# Patient Record
Sex: Male | Born: 1980 | Race: Black or African American | Hispanic: No | Marital: Married | Smoking: Never smoker
Health system: Southern US, Community
[De-identification: ages and names within clinical notes are randomized; demographics above are authoritative.]

## PROBLEM LIST (undated history)

## (undated) DIAGNOSIS — E669 Obesity, unspecified: Secondary | ICD-10-CM

## (undated) DIAGNOSIS — R079 Chest pain, unspecified: Secondary | ICD-10-CM

## (undated) DIAGNOSIS — I10 Essential (primary) hypertension: Secondary | ICD-10-CM

## (undated) DIAGNOSIS — G4730 Sleep apnea, unspecified: Secondary | ICD-10-CM

---
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
## 2001-07-02 ENCOUNTER — Emergency Department (HOSPITAL_COMMUNITY): Admission: EM | Admit: 2001-07-02 | Discharge: 2001-07-02 | Payer: Self-pay | Admitting: Emergency Medicine

## 2001-07-02 ENCOUNTER — Encounter: Payer: Self-pay | Admitting: Emergency Medicine

## 2002-09-16 ENCOUNTER — Encounter: Admission: RE | Admit: 2002-09-16 | Discharge: 2002-09-16 | Payer: Self-pay | Admitting: Family Medicine

## 2003-10-16 ENCOUNTER — Emergency Department (HOSPITAL_COMMUNITY): Admission: EM | Admit: 2003-10-16 | Discharge: 2003-10-16 | Payer: Self-pay | Admitting: Emergency Medicine

## 2004-01-02 ENCOUNTER — Emergency Department (HOSPITAL_COMMUNITY): Admission: EM | Admit: 2004-01-02 | Discharge: 2004-01-02 | Payer: Self-pay | Admitting: Emergency Medicine

## 2004-02-14 ENCOUNTER — Emergency Department (HOSPITAL_COMMUNITY): Admission: EM | Admit: 2004-02-14 | Discharge: 2004-02-14 | Payer: Self-pay | Admitting: Emergency Medicine

## 2004-10-22 ENCOUNTER — Ambulatory Visit: Payer: Self-pay | Admitting: Family Medicine

## 2005-08-20 ENCOUNTER — Emergency Department (HOSPITAL_COMMUNITY): Admission: EM | Admit: 2005-08-20 | Discharge: 2005-08-20 | Payer: Self-pay | Admitting: Emergency Medicine

## 2005-12-07 ENCOUNTER — Emergency Department (HOSPITAL_COMMUNITY): Admission: AD | Admit: 2005-12-07 | Discharge: 2005-12-07 | Payer: Self-pay | Admitting: Family Medicine

## 2008-10-14 ENCOUNTER — Emergency Department (HOSPITAL_COMMUNITY): Admission: EM | Admit: 2008-10-14 | Discharge: 2008-10-14 | Payer: Self-pay | Admitting: Emergency Medicine

## 2008-10-18 ENCOUNTER — Emergency Department (HOSPITAL_COMMUNITY): Admission: EM | Admit: 2008-10-18 | Discharge: 2008-10-18 | Payer: Self-pay | Admitting: Emergency Medicine

## 2009-09-10 ENCOUNTER — Emergency Department (HOSPITAL_COMMUNITY): Admission: EM | Admit: 2009-09-10 | Discharge: 2009-09-10 | Payer: Self-pay | Admitting: Family Medicine

## 2015-02-10 ENCOUNTER — Ambulatory Visit (INDEPENDENT_AMBULATORY_CARE_PROVIDER_SITE_OTHER): Payer: BC Managed Care – PPO | Admitting: Endocrinology

## 2015-02-10 ENCOUNTER — Encounter: Payer: Self-pay | Admitting: Endocrinology

## 2015-02-10 VITALS — BP 140/86 | HR 97 | Temp 98.3°F | Resp 16 | Ht 73.0 in | Wt 373.4 lb

## 2015-02-10 DIAGNOSIS — E291 Testicular hypofunction: Secondary | ICD-10-CM

## 2015-02-10 DIAGNOSIS — G4710 Hypersomnia, unspecified: Secondary | ICD-10-CM

## 2015-02-10 DIAGNOSIS — R400 Somnolence: Secondary | ICD-10-CM

## 2015-02-10 NOTE — Progress Notes (Signed)
Patient ID: Timothy [name], male   DOB: 05-Jun-1981, 34 y.o.   MRN: [id number]          Chief complaint: Low energy level  History of Present Illness  HypogonadismWas diagnosed in 12/2014  He has had complaints ofsignificant fatigue, decreased motivation, significantly decreased libido, lack of energy  The symptoms started in 2015 at the end of summer and have been persistent He also has had a tendency to breast enlargement since his late teens but it has been more prominent in the last year and a half.    There is no history of the following: Hot flushes, sweats long term steroid use, history of testicular injury mumps in childhood. He had an episode of epididymitis a few years ago treated with antibiotics Patient does desire to have children in about a year or so.  He does have a 7649-month-old child currently  Prior lab results show baselinetestosterone level of 229 drawn at 10 AM in 12/2014 Has not had any other labs done Is now referred here for further evaluation       Medication List       This list is accurate as of: 02/10/15  2:09 PM.  Always use your most recent med list.               valACYclovir 500 MG tablet  Commonly known as:  VALTREX        Allergies:  Allergies  Allergen Reactions  . Amoxicillin Rash    No past medical history on file.  No past surgical history on file.  No family history on file.  Social History:  reports that he has never smoked. He has never used smokeless tobacco. His alcohol and drug histories are not on file.  ROS   WEIGHT gain: He has had difficulty losing weight.  Currently at his maximum weight ever. He says that he has gained about 15 pounds in the last few months His best weight has been about 265 but was never able to maintain this weight loss  No history of unusual headaches  No history of blurred vision including peripheral vision Sometimes will have floaters in his eyes  History of hypertension:  None.  Blood pressure is relatively high today.  No records are available from previous physician  Has history of some palpitations and was reportedly having some PVCs on Holter monitor  No history of abnormal fasting glucose or diabetes but is nonfasting glucose was 127 in 3/16  He complains of early insomnia.  Also complains of daytime somnolence He does have significant snoring and his wife thinks that he has some sleep apnea episodes during the night at times  No swelling of the feet  No change in bowel habits  No numbness or tingling in his hands or feet except some numbness and he is sitting for long  No shortness of breath on exertion  General Examination:   BP 140/86 mmHg  Pulse 97  Temp(Src) 98.3 F (36.8 C)  Resp 16  Ht 6\' 1"  (1.854 m)  Wt 373 lb 6.4 oz (169.373 kg)  BMI 49.27 kg/m2  SpO2 97%  GENERAL APPEARANCE: Marked generalized obesity present.  No cushingoid features  SKIN:normal, no rash or pigmentation.  HEENT:Oral mucosa normal. Normal oropharyngeal opening.  EYES:normal external appearance of eyes, Fundi benign.   NECK:Skin shows no significant acanthosis.  no lymphadenopathy, no thyromegaly.  CHEST: Gynecomastia present bilaterally, with breast tissue measuring about 3-4 cm.  Also has a lot of  fatty tissue in the breast area LUNGS:clear to auscultation bilaterally, no wheezes, rhonchi, rales.   HEART:normal S1 And S2, no S3, S4, murmur or click.  ABDOMEN:no hepatosplenomegaly, no masses palpated, soft and not tender.   MALE GENITOURINARY:3.5 left testicle cm and right testicle 3 cm and firm.   MUSCULOSKELETALNo enlargement or deformity of joints.  EXTREMITIES:no clubbing, no edema.  NEUROLOGIC EXAM: Biceps reflexes normal (2+) bilaterally.   Assessment/ Plan: 1. 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 On exam he has mild decrease in testicular size especially on the right and significant gynecomastia He has not had any evaluation for causes of his hypogonadism and does also need to have early morning free testosterone level done to confirm low levels Will have LH and prolactin levels done also  Discussed treatment options and since he and his wife may desire to have a child next year may not be optimal to use testosterone supplements in order to preserve fertility.  Discussed possible treatment with clomiphene. If this is started he will need to be followed up about 2 months later with repeat testosterone level Also discussed the importance of weight loss to improve insulin resistance which he likely has  2.  Morbid obesity: He will need to be starting a regular exercise program although currently not doing much because of lack of energy.  Also would benefit from nutritional counseling especially if he has any degree of impaired fasting glucose  3.  Family history of diabetes: He will have fasting glucose checked, may need A1c also if this is high  4.  Probable sleep apnea.  He will contact his PCP to consider a sleep study   Timothy [name] 02/10/2015, 2:09 PM

## 2015-02-15 ENCOUNTER — Other Ambulatory Visit (INDEPENDENT_AMBULATORY_CARE_PROVIDER_SITE_OTHER): Payer: BC Managed Care – PPO

## 2015-02-15 DIAGNOSIS — E291 Testicular hypofunction: Secondary | ICD-10-CM

## 2015-02-15 LAB — LUTEINIZING HORMONE: LH: 3.19 m[IU]/mL (ref 1.50–9.30)

## 2015-02-15 LAB — GLUCOSE, RANDOM: Glucose, Bld: 105 mg/dL — ABNORMAL HIGH (ref 70–99)

## 2015-02-16 ENCOUNTER — Other Ambulatory Visit: Payer: Self-pay | Admitting: *Deleted

## 2015-02-16 ENCOUNTER — Telehealth: Payer: Self-pay | Admitting: Endocrinology

## 2015-02-16 ENCOUNTER — Other Ambulatory Visit: Payer: Self-pay | Admitting: Endocrinology

## 2015-02-16 DIAGNOSIS — R7301 Impaired fasting glucose: Secondary | ICD-10-CM

## 2015-02-16 MED ORDER — CLOMIPHENE CITRATE 50 MG PO TABS: ORAL_TABLET | ORAL | Status: DC

## 2015-02-16 NOTE — Telephone Encounter (Signed)
Results were given to patient today, rx was sent for Clomid.

## 2015-02-16 NOTE — Telephone Encounter (Signed)
Patient is calling for his lab results. °

## 2015-02-18 LAB — TESTOSTERONE, FREE, TOTAL, SHBG
Sex Hormone Binding: 23.1 nmol/L (ref 16.5–55.9)
Testosterone, Free: 6.5 pg/mL — ABNORMAL LOW (ref 8.7–25.1)
Testosterone: 290 ng/dL — ABNORMAL LOW (ref 348–1197)

## 2015-02-18 LAB — PROLACTIN: PROLACTIN: 9.6 ng/mL (ref 4.0–15.2)

## 2015-04-17 ENCOUNTER — Ambulatory Visit (INDEPENDENT_AMBULATORY_CARE_PROVIDER_SITE_OTHER): Payer: BC Managed Care – PPO | Admitting: Endocrinology

## 2015-04-17 ENCOUNTER — Encounter: Payer: Self-pay | Admitting: Endocrinology

## 2015-04-17 VITALS — BP 126/90 | HR 83 | Temp 98.1°F | Resp 16 | Ht 72.0 in | Wt 388.2 lb

## 2015-04-17 DIAGNOSIS — E291 Testicular hypofunction: Secondary | ICD-10-CM | POA: Diagnosis not present

## 2015-04-17 DIAGNOSIS — R7301 Impaired fasting glucose: Secondary | ICD-10-CM

## 2015-04-17 DIAGNOSIS — G4710 Hypersomnia, unspecified: Secondary | ICD-10-CM

## 2015-04-17 DIAGNOSIS — R400 Somnolence: Secondary | ICD-10-CM

## 2015-04-17 LAB — TESTOSTERONE: Testosterone: 414.23 ng/dL (ref 300.00–890.00)

## 2015-04-17 LAB — HEMOGLOBIN A1C: Hgb A1c MFr Bld: 5.7 % (ref 4.6–6.5)

## 2015-04-17 LAB — T4, FREE: Free T4: 0.71 ng/dL (ref 0.60–1.60)

## 2015-04-17 NOTE — Progress Notes (Signed)
Quick Note:  Please let patient know that the testosterone result is normal and change in dosage needed, diabetes test okay. Can follow-up in 4 months instead of 2 with labs  ______

## 2015-04-17 NOTE — Progress Notes (Signed)
Patient ID: LARWENCE TU, male   DOB: March 15, 1981, 34 y.o.   MRN: [id number]          Chief complaint: Low testosterone follow-up  History of Present Illness  HypogonadismWas diagnosed in 12/2014  He had complaints ofsignificant fatigue, decreased motivation, significantly decreased libido and breast enlargement prior to his evaluation The symptoms started in 2015 at the end of summer   Prior lab results show baselinetestosterone level of 229 drawn at 10 AM in 12/2014 His evaluation initially showed the following:  No visits with results within 1 Month(s) from this visit. Latest known visit with results is:  Lab on 02/15/2015  Component Date Value Ref Range Status  . Prolactin 02/15/2015 9.6  4.0 - 15.2 ng/mL Final  . LH 02/15/2015 3.19  1.50 - 9.30 mIU/mL Final   Comment: Male Reference Range:20-70 yrs     1.5-9.3 mIU/mL>70 yrs       3.1-35.6 mIU/mLFemale Reference Range:Follicular Phase     1.9-12.5 mIU/mLMidcycle             8.7-76.3 mIU/mLLuteal Phase         0.5-16.9 mIU/mL  Post Menopausal      15.9-54.0  mIU/mLPregnant             <1.5 mIU/mLContraceptives       0.7-5.6 mIU/mL   . Testosterone 02/15/2015 290* 348 - 1197 ng/dL Final  . Comment, Testosterone 02/15/2015 Comment   Final   Comment: Adult male reference interval is based on a population of lean males up to 33 years old.   . Testosterone, Free 02/15/2015 6.5* 8.7 - 25.1 pg/mL Final  . Sex Hormone Binding 02/15/2015 23.1  16.5 - 55.9 nmol/L Final  . Glucose, Bld 02/15/2015 105* 70 - 99 mg/dL Final    Because of his desire to maintain his fertility he has been started on clomiphene half tablet 3 times a week. He says within a week of starting this he has had more energy and less somnolence.  Also libido is improved  OBESITY: He says he has started doing exercise with a trainer 3 days a week but with a two-week vacation in [place] he did not exercise or watch his diet and has actually gained weight. He  has not been able to set up is appointment with his nutritionist for initial consultation  FASTING glucose was 105, not previously diagnosed as prediabetes   Wt Readings from Last 3 Encounters:  04/17/15 388 lb 3.2 oz (176.086 kg)  02/10/15 373 lb 6.4 oz (169.373 kg)         Medication List       This list is accurate as of: 04/17/15  9:13 AM.  Always use your most recent med list.               clomiPHENE 50 MG tablet  Commonly known as:  CLOMID  Take half a tablet 3 times per week     valACYclovir 500 MG tablet  Commonly known as:  VALTREX        Allergies:  Allergies  Allergen Reactions  . Amoxicillin Rash    No past medical history on file.  No past surgical history on file.  Family History  Problem Relation Age of Onset  . Diabetes Mother   . Hypertension Mother   . Diabetes Sister     Social History:  reports that he has never smoked. He has never used smokeless tobacco. His alcohol and drug histories are not on  file.  ROS    He has had complaints of early insomnia.  Also some daytime somnolence He does have significant snoring and his wife thinks that he has some sleep apnea episodes during the night at times   General Examination:   BP 126/90 mmHg  Pulse 83  Temp(Src) 98.1 F (36.7 C)  Resp 16  Ht 6' (1.829 m)  Wt 388 lb 3.2 oz (176.086 kg)  BMI 52.64 kg/m2  SpO2 96%    Assessment/ Plan: 1. Hypogonadotropic hypogonadism associated with obesity He has had subjective improvement when starting clomiphene Needs follow-up testosterone level to adjust his medication Also discussed need for continued efforts to lose weight as he is likely to be significantly insulin resistant causing the above syndrome Will adjust his clomiphene as needed based on his labs today  2.  Morbid obesity: He will be starting a regular exercise program and schedule nutritional counseling  3.  Family history of diabetes: He has impaired fasting glucose and  will check A1c   4.  Probable sleep apnea.  To be evaluated by PCP.  Also needs to be followed for his hypertension by PCP Will check his free T4 level to rule out secondary hypothyroidism also   Bobetta Korf 04/17/2015, 9:13 AM

## 2015-04-17 NOTE — Addendum Note (Signed)
Addended by: Gayla MedicusSTEWART, Dvid Pendry L on: 04/17/2015 09:32 AM   Modules accepted: Orders

## 2015-06-14 ENCOUNTER — Other Ambulatory Visit: Payer: BC Managed Care – PPO

## 2015-06-19 ENCOUNTER — Ambulatory Visit: Payer: BC Managed Care – PPO | Admitting: Endocrinology

## 2015-07-10 ENCOUNTER — Telehealth: Payer: Self-pay | Admitting: Endocrinology

## 2015-07-10 NOTE — Telephone Encounter (Signed)
Needs [place] to [facility] in [facility]

## 2015-07-21 ENCOUNTER — Other Ambulatory Visit (INDEPENDENT_AMBULATORY_CARE_PROVIDER_SITE_OTHER): Payer: BC Managed Care – PPO

## 2015-07-21 ENCOUNTER — Other Ambulatory Visit: Payer: Self-pay | Admitting: *Deleted

## 2015-07-21 DIAGNOSIS — E291 Testicular hypofunction: Secondary | ICD-10-CM | POA: Diagnosis not present

## 2015-07-21 LAB — TESTOSTERONE: Testosterone: 282.94 ng/dL — ABNORMAL LOW (ref 300.00–890.00)

## 2015-07-21 MED ORDER — CLOMIPHENE CITRATE 50 MG PO TABS: ORAL_TABLET | ORAL | Status: DC

## 2015-07-28 ENCOUNTER — Encounter: Payer: Self-pay | Admitting: Endocrinology

## 2015-07-28 ENCOUNTER — Ambulatory Visit (INDEPENDENT_AMBULATORY_CARE_PROVIDER_SITE_OTHER): Payer: BC Managed Care – PPO | Admitting: Endocrinology

## 2015-07-28 VITALS — BP 120/78 | HR 77 | Temp 98.3°F | Resp 14 | Ht 72.0 in | Wt 388.6 lb

## 2015-07-28 DIAGNOSIS — E291 Testicular hypofunction: Secondary | ICD-10-CM | POA: Diagnosis not present

## 2015-07-28 NOTE — Progress Notes (Signed)
Patient ID: Timothy [name], male   DOB: 1980/12/22, 34 y.o.   MRN: [id number]          Chief complaint: Low testosterone follow-up  History of Present Illness  HypogonadismWas diagnosed in 12/2014  He had complaints ofsignificant fatigue, decreased motivation, significantly decreased libido and breast enlargement prior to his evaluation The symptoms started in 2015 at the end of summer   Prior lab results show baselinetestosterone level of 229 drawn at 10 AM in 12/2014 His evaluation initially showed the following labs Free testosterone level was 6.5, prolactin 9.6, LH 3.2  Because of his desire to maintain his fertility he has been on clomiphene half tablet 3 times a week. With this treatment he has had progressive improvement in his energy level as well as libido and has been feeling good overall Although his testosterone level and improve back to normal range in July he has a relatively low level now He now says that he has been missing a few doses of clomiphene both in late September and before his lab was done  Lab Results  Component Value Date   TESTOSTERONE 282.94* 07/21/2015     OBESITY: He says he has started doing exercise with a trainer 3 days a week for the last 2-3 months  However with going on vacation twice he has not been able to lose weight  FASTING glucose was 105 previously but A1c is normal    Wt Readings from Last 3 Encounters:  07/28/15 388 lb 9.6 oz (176.268 kg)  04/17/15 388 lb 3.2 oz (176.086 kg)  02/10/15 373 lb 6.4 oz (169.373 kg)         Medication List       This list is accurate as of: 07/28/15  9:00 AM.  Always use your most recent med list.               clomiPHENE 50 MG tablet  Commonly known as:  CLOMID  Take half a tablet 3 times per week     valACYclovir 500 MG tablet  Commonly known as:  VALTREX        Allergies:  Allergies  Allergen Reactions  . Amoxicillin Rash    No past medical history on  file.  No past surgical history on file.  Family History  Problem Relation Age of Onset  . Diabetes Mother   . Hypertension Mother   . Diabetes Sister     Social History:  reports that he has never smoked. He has never used smokeless tobacco. His alcohol and drug histories are not on file.  ROS    History of significant snoring and has been recommended sleep study   General Examination:   BP 120/78 mmHg  Pulse 77  Temp(Src) 98.3 F (36.8 C)  Resp 14  Ht 6' (1.829 m)  Wt 388 lb 9.6 oz (176.268 kg)  BMI 52.69 kg/m2  SpO2 96%  He still has significant bilateral gynecomastia  Assessment/ Plan: 1. Hypogonadotropic hypogonadism associated with obesity He has had subjective improvement when starting clomiphene Although his testosterone level had come back to the normal range with the half tablet 3 times a day the level is again relatively low now because of noncompliance with the medication within the month prior to his test  He will start improving his compliance from now on Needs follow-up testosterone level in about 6 weeks again  2.  Morbid obesity: He has been starting a regular exercise program but has not been consistent  with diet especially with vacationing   Lamona Eimer 07/28/2015, 9:00 AM

## 2015-09-22 ENCOUNTER — Other Ambulatory Visit: Payer: BC Managed Care – PPO

## 2015-11-23 ENCOUNTER — Other Ambulatory Visit: Payer: BC Managed Care – PPO

## 2016-01-26 ENCOUNTER — Ambulatory Visit: Payer: BC Managed Care – PPO | Admitting: Endocrinology

## 2016-01-26 DIAGNOSIS — Z0289 Encounter for other administrative examinations: Secondary | ICD-10-CM

## 2016-03-30 ENCOUNTER — Emergency Department (HOSPITAL_COMMUNITY): Payer: BC Managed Care – PPO

## 2016-03-30 ENCOUNTER — Observation Stay (HOSPITAL_COMMUNITY)
Admission: EM | Admit: 2016-03-30 | Discharge: 2016-03-31 | Disposition: A | Discharge status: Home / Self Care | Payer: BC Managed Care – PPO | Attending: Family Medicine | Admitting: Family Medicine

## 2016-03-30 ENCOUNTER — Other Ambulatory Visit: Payer: Self-pay

## 2016-03-30 ENCOUNTER — Encounter (HOSPITAL_COMMUNITY): Payer: Self-pay | Admitting: Emergency Medicine

## 2016-03-30 DIAGNOSIS — R0789 Other chest pain: Secondary | ICD-10-CM

## 2016-03-30 DIAGNOSIS — E669 Obesity, unspecified: Secondary | ICD-10-CM | POA: Diagnosis present

## 2016-03-30 DIAGNOSIS — R079 Chest pain, unspecified: Principal | ICD-10-CM

## 2016-03-30 DIAGNOSIS — R9431 Abnormal electrocardiogram [ECG] [EKG]: Secondary | ICD-10-CM | POA: Diagnosis present

## 2016-03-30 HISTORY — DX: Obesity, unspecified: E66.9

## 2016-03-30 HISTORY — DX: Chest pain, unspecified: R07.9

## 2016-03-30 LAB — CBC WITH DIFFERENTIAL/PLATELET
BASOS ABS: 0 10*3/uL (ref 0.0–0.1)
BASOS PCT: 0 %
Eosinophils Absolute: 0.4 10*3/uL (ref 0.0–0.7)
Eosinophils Relative: 6 %
HEMATOCRIT: 39.5 % (ref 39.0–52.0)
HEMOGLOBIN: 13.6 g/dL (ref 13.0–17.0)
Lymphocytes Relative: 35 %
Lymphs Abs: 2.1 10*3/uL (ref 0.7–4.0)
MCH: 29.7 pg (ref 26.0–34.0)
MCHC: 34.4 g/dL (ref 30.0–36.0)
MCV: 86.2 fL (ref 78.0–100.0)
Monocytes Absolute: 0.3 10*3/uL (ref 0.1–1.0)
Monocytes Relative: 4 %
NEUTROS ABS: 3.3 10*3/uL (ref 1.7–7.7)
NEUTROS PCT: 55 %
Platelets: 218 10*3/uL (ref 150–400)
RBC: 4.58 MIL/uL (ref 4.22–5.81)
RDW: 12.8 % (ref 11.5–15.5)
WBC: 6 10*3/uL (ref 4.0–10.5)

## 2016-03-30 LAB — LIPID PANEL
Cholesterol: 115 mg/dL (ref 0–200)
HDL: 35 mg/dL — AB (ref >40)
LDL CALC: 22 mg/dL (ref 0–99)
Total CHOL/HDL Ratio: 3.3 RATIO
Triglycerides: 288 mg/dL — ABNORMAL HIGH (ref <150)
VLDL: 58 mg/dL — ABNORMAL HIGH (ref 0–40)

## 2016-03-30 LAB — COMPREHENSIVE METABOLIC PANEL
ALK PHOS: 48 U/L (ref 38–126)
ALT: 24 U/L (ref 17–63)
ANION GAP: 6 (ref 5–15)
AST: 26 U/L (ref 15–41)
Albumin: 3.6 g/dL (ref 3.5–5.0)
BILIRUBIN TOTAL: 0.2 mg/dL — AB (ref 0.3–1.2)
BUN: 8 mg/dL (ref 6–20)
CALCIUM: 9.2 mg/dL (ref 8.9–10.3)
CO2: 26 mmol/L (ref 22–32)
Chloride: 109 mmol/L (ref 101–111)
Creatinine, Ser: 0.98 mg/dL (ref 0.61–1.24)
Glucose, Bld: 117 mg/dL — ABNORMAL HIGH (ref 65–99)
Potassium: 3.8 mmol/L (ref 3.5–5.1)
SODIUM: 141 mmol/L (ref 135–145)
TOTAL PROTEIN: 6.5 g/dL (ref 6.5–8.1)

## 2016-03-30 LAB — I-STAT TROPONIN, ED: Troponin i, poc: 0 ng/mL (ref 0.00–0.08)

## 2016-03-30 LAB — TROPONIN I

## 2016-03-30 MED ORDER — SODIUM CHLORIDE 0.9 % IV SOLN: INTRAVENOUS | Status: AC

## 2016-03-30 MED ORDER — ASPIRIN 325 MG PO TABS
325.0000 mg | ORAL_TABLET | Freq: Every day | ORAL | Status: DC
  Administered 2016-03-31: 325 mg via ORAL
  Filled 2016-03-30: qty 1

## 2016-03-30 MED ORDER — NITROGLYCERIN 0.4 MG SL SUBL: 0.4000 mg | SUBLINGUAL_TABLET | SUBLINGUAL | Status: DC | PRN

## 2016-03-30 MED ORDER — MORPHINE SULFATE (PF) 2 MG/ML IV SOLN: 2.0000 mg | INTRAVENOUS | Status: DC | PRN

## 2016-03-30 MED ORDER — ONDANSETRON HCL 4 MG/2ML IJ SOLN: 4.0000 mg | Freq: Four times a day (QID) | INTRAMUSCULAR | Status: DC | PRN

## 2016-03-30 MED ORDER — ACETAMINOPHEN 325 MG PO TABS: 650.0000 mg | ORAL_TABLET | ORAL | Status: DC | PRN

## 2016-03-30 MED ORDER — ENOXAPARIN SODIUM 40 MG/0.4ML ~~LOC~~ SOLN: 40.0000 mg | SUBCUTANEOUS | Status: DC

## 2016-03-30 MED ORDER — GI COCKTAIL ~~LOC~~: 30.0000 mL | Freq: Four times a day (QID) | ORAL | Status: DC | PRN

## 2016-03-30 NOTE — ED Notes (Signed)
Patient transported to X-ray 

## 2016-03-30 NOTE — ED Notes (Addendum)
324 aspirin and 1 nitro given by EMS; pain remained unchanged. BP dropped. Patient states he has this pain twice a week with activity and the only reason this is different is it radiated down arm which prompted him to call EMS.

## 2016-03-30 NOTE — H&P (Signed)
History and Physical    Timothy [name] ZOX:[id number] DOB: October 31, 1980 DOA: 03/30/2016  PCP: Boneta LucksJennifer Brown, NP Patient coming from: home  Chief Complaint: chest pain with exertion  HPI: Timothy PaganiniJonathan A Grafton is a 35 y.o. male with no significant past medical history resents to the emergency department with the chief complaint of chest pain. Initial evaluation does reveal an abnormal EKG and patient is evaluated by cardiology who recommend admitting for rule out  Information is obtained from the patient he reports episode of chest pain while moving 3 lens this morning. He describes the pain as a "tightness" located left chest radiating out into his right arm. He denies any headache dizziness diaphoresis nausea or vomiting. He denies any palpitations. He denies a numbness or tingling of his upper extremities. He reports the episode lasted 4-5 minutes. He denies any abdominal pain nausea vomiting diarrhea constipation. He denies any dysuria hematuria frequency or urgency. He denies headache visual disturbances syncope or near-syncope. He denies any lower extremity edema orthopnea.    ED Course:   Review of Systems: As per HPI otherwise 10 point review of systems negative.   Ambulatory Status: Ambulates independently with steady gait  Past Medical History  Diagnosis Date  . Exertional chest pain   . Obesity     History reviewed. No pertinent past surgical history.  Social History   Social History  . Marital Status: Married    Spouse Name: N/A  . Number of Children: N/A  . Years of Education: N/A   Occupational History  . Not on file.   Social History Main Topics  . Smoking status: Never Smoker   . Smokeless tobacco: Never Used  . Alcohol Use: Not on file  . Drug Use: Not on file  . Sexual Activity: Not on file   Other Topics Concern  . Not on file   Social History Narrative    Allergies  Allergen Reactions  . Amoxicillin Rash    Family History  Problem Relation  Age of Onset  . Diabetes Mother   . Hypertension Mother   . Diabetes Sister     Prior to Admission medications   Medication Sig Start Date End Date Taking? Authorizing Provider  Multiple Vitamin (MULTIVITAMIN) tablet Take 1 tablet by mouth daily.   Yes Historical Provider, MD  valACYclovir (VALTREX) 500 MG tablet Take 500 mg by mouth daily as needed (for outbreaks).  01/01/15  Yes Historical Provider, MD    Physical Exam: Filed Vitals:   03/30/16 1354 03/30/16 1400 03/30/16 1415  BP: 133/65 127/68 131/74  Pulse: 102 103 96  Temp: 98.5 F (36.9 C)    TempSrc: Oral    Resp: 22 12 16   Weight: 167.377 kg (369 lb)    SpO2: 96% 96% 97%     General:  Appears calm and comfortable,  Eyes:  PERRL, EOMI, normal lids, iris ENT:  grossly normal hearing, lips & tongue, mmm Neck:  no LAD, masses or thyromegaly Cardiovascular:  RRR, no m/r/g. No LE edema.  Respiratory:  CTA bilaterally, no w/r/r. Normal respiratory effort. Abdomen:  soft, ntnd, NABS Skin:  no rash or induration seen on limited exam Musculoskeletal:  grossly normal tone BUE/BLE, good ROM, no bony abnormality Psychiatric:  grossly normal mood and affect, speech fluent and appropriate, AOx3 Neurologic:  CN 2-12 grossly intact, moves all extremities in coordinated fashion, sensation intact  Labs on Admission: I have personally reviewed following labs and imaging studies  CBC:  Recent Labs Lab 03/30/16  1444  WBC 6.0  NEUTROABS 3.3  HGB 13.6  HCT 39.5  MCV 86.2  PLT 218   Basic Metabolic Panel:  Recent Labs Lab 03/30/16 1444  NA 141  K 3.8  CL 109  CO2 26  GLUCOSE 117*  BUN 8  CREATININE 0.98  CALCIUM 9.2   GFR: CrCl cannot be calculated (Unknown ideal weight.). Liver Function Tests:  Recent Labs Lab 03/30/16 1444  AST 26  ALT 24  ALKPHOS 48  BILITOT 0.2*  PROT 6.5  ALBUMIN 3.6   No results for input(s): LIPASE, AMYLASE in the last 168 hours. No results for input(s): AMMONIA in the last 168  hours. Coagulation Profile: No results for input(s): INR, PROTIME in the last 168 hours. Cardiac Enzymes: No results for input(s): CKTOTAL, CKMB, CKMBINDEX, TROPONINI in the last 168 hours. BNP (last 3 results) No results for input(s): PROBNP in the last 8760 hours. HbA1C: No results for input(s): HGBA1C in the last 72 hours. CBG: No results for input(s): GLUCAP in the last 168 hours. Lipid Profile: No results for input(s): CHOL, HDL, LDLCALC, TRIG, CHOLHDL, LDLDIRECT in the last 72 hours. Thyroid Function Tests: No results for input(s): TSH, T4TOTAL, FREET4, T3FREE, THYROIDAB in the last 72 hours. Anemia Panel: No results for input(s): VITAMINB12, FOLATE, FERRITIN, TIBC, IRON, RETICCTPCT in the last 72 hours. Urine analysis: No results found for: COLORURINE, APPEARANCEUR, LABSPEC, PHURINE, GLUCOSEU, HGBUR, BILIRUBINUR, KETONESUR, PROTEINUR, UROBILINOGEN, NITRITE, LEUKOCYTESUR  Creatinine Clearance: CrCl cannot be calculated (Unknown ideal weight.).  Sepsis Labs: @LABRCNTIP (procalcitonin:4,lacticidven:4) )No results found for this or any previous visit (from the past 240 hour(s)).   Radiological Exams on Admission: Dg Chest 2 View  03/30/2016  CLINICAL DATA:  Pt was doing yard work when he began having a strange pain in his arm. 324 aspirin and 1 nitro given by EMS; pain remained unchanged. BP dropped. Patient states he has this pain twice a week with activity. EXAM: CHEST  2 VIEW COMPARISON:  08/20/2005 FINDINGS: Normal mediastinum and cardiac silhouette. Normal pulmonary vasculature. No evidence of effusion, infiltrate, or pneumothorax. No acute bony abnormality. IMPRESSION: No acute cardiopulmonary process. Electronically Signed   By: Genevive BiStewart  Edmunds M.D.   On: 03/30/2016 14:50    EKG: Independently reviewed. Sinus tachycardia Borderline T abnormalities, inferior leads Abnormal ekg Assessment/Plan Principal Problem:   Abnormal EKG Active Problems:   Chest pain   Obesity     #1. Chest pain with exertion/abnormal EKG. Heart score is 1. She'll troponin negative. Chest x-ray without acute cardiopulmonary process. Patient is evaluated by cardiology who recommends overnight admission to rule out. -Admit to telemetry -Cycle troponin -Serial EKG -Lipid panel -Hemoglobin A1c -asa -ntg as needed -gi cocktail as needed  #2. Obesity. Weight 369 pounds -Nutritional consult     DVT prophylaxis: lovenox  Code Status: full  Family Communication: none  Disposition Plan: home  Consults called: dr branch with cardiology  Admission status: obs    Toya SmothersBLACK,KAREN M MD [facility]  If 7PM-7AM, please contact night-coverage [email or web address] Password [email or web address]  03/30/2016, 3:58 PM

## 2016-03-30 NOTE — ED Notes (Signed)
Admitting MD at bedside.

## 2016-03-30 NOTE — Consult Note (Signed)
Primary cardiologist: N/A Consulting cardiologist: Dr Dina RichJonathan Libia Fazzini MD Requesting provider: PA Marijean NiemannJaime Ward Indication: Chest pain   Clinical Summary Mr. Timothy [name] is a 35 y.o.male who denies any previous medical history presents with chest pain. He reports episode this morning while moving tree limbs of chest tightness 6/10 left chest, radiating into right arm. No other associated symptoms. Lasted approximtely 4-5 minutes. Not positional. He reports for the last few months exertional chest tightness. Mainly occurs at the gym while on the ellipital, he actually limits his level of exertion to avoid experiencing symptoms. Reports symptoms occur at a moderate to high level of exertion.    Hgb 13.6, Plt 218, K 3.8, Cr 0.98, BUN 8 EKG SR, nonspecific inferior ST/T changes CXR no acute process      Allergies  Allergen Reactions  . Amoxicillin Rash    Medications Scheduled Medications:    Infusions:    PRN Medications:     History reviewed. No pertinent past medical history.  History reviewed. No pertinent past surgical history.  Family History  Problem Relation Age of Onset  . Diabetes Mother   . Hypertension Mother   . Diabetes Sister     Social History Mr. Timothy [name] reports that he has never smoked. He has never used smokeless tobacco. Mr. Timothy [name] has no alcohol history on file.  Review of Systems CONSTITUTIONAL: No weight loss, fever, chills, weakness or fatigue.  HEENT: Eyes: No visual loss, blurred vision, double vision or yellow sclerae. No hearing loss, sneezing, congestion, runny nose or sore throat.  SKIN: No rash or itching.  CARDIOVASCULAR: per HPI RESPIRATORY: No shortness of breath, cough or sputum.  GASTROINTESTINAL: No anorexia, nausea, vomiting or diarrhea. No abdominal pain or blood.  GENITOURINARY: no polyuria, no dysuria NEUROLOGICAL: No headache, dizziness, syncope, paralysis, ataxia, numbness or tingling in the extremities. No change in bowel  or bladder control.  MUSCULOSKELETAL: No muscle, back pain, joint pain or stiffness.  HEMATOLOGIC: No anemia, bleeding or bruising.  LYMPHATICS: No enlarged nodes. No history of splenectomy.  PSYCHIATRIC: No history of depression or anxiety.      Physical Examination Blood pressure 131/74, pulse 96, temperature 98.5 F (36.9 C), temperature source Oral, resp. rate 16, weight 369 lb (167.377 kg), SpO2 97 %. No intake or output data in the 24 hours ending 03/30/16 1523  HEENT: sclera clear, throat clear  Cardiovascular: RRR, no m/r/g, no jvd  Respiratory: CTAB  GI: abdomen soft, NT, ND  MSK: no LE edema  Neuro: no focal deficits  Psych: appropriate affect   Lab Results  Basic Metabolic Panel:  Recent Labs Lab 03/30/16 1444  NA 141  K 3.8  CL 109  CO2 26  GLUCOSE 117*  BUN 8  CREATININE 0.98  CALCIUM 9.2    Liver Function Tests:  Recent Labs Lab 03/30/16 1444  AST 26  ALT 24  ALKPHOS 48  BILITOT 0.2*  PROT 6.5  ALBUMIN 3.6    CBC:  Recent Labs Lab 03/30/16 1444  WBC 6.0  NEUTROABS 3.3  HGB 13.6  HCT 39.5  MCV 86.2  PLT 218    Cardiac Enzymes: No results for input(s): CKTOTAL, CKMB, CKMBINDEX, TROPONINI in the last 168 hours.  BNP: Invalid input(s): POCBNP      Impression/Recommendations 1. Chest pain - young obese male without significantly documented CAD risk factors presents with several month history of exertional chest pain - initial enzymes negative, EKG with nonspecific inferior changes - recommend rule out overnight, would make  NPO in case stress test needed tomorrow.  - please check HgbA1c and FLP for further risk stratification    Dina RichJonathan Yeilin Zweber, M.D.

## 2016-03-30 NOTE — ED Provider Notes (Signed)
CSN: [id number]     Arrival date & time 03/30/16  1354 History   First MD Initiated Contact with Patient 03/30/16 1400     Chief Complaint  Patient presents with  . Chest Pain    (Consider location/radiation/quality/duration/timing/severity/associated sxs/prior Treatment) Patient is a 35 y.o. male presenting with chest pain. The history is provided by the patient and medical records. No language interpreter was used.  Chest Pain Associated symptoms: dizziness, numbness and palpitations   Associated symptoms: no abdominal pain, no back pain, no cough, no diaphoresis, no fever, no headache, no nausea, no shortness of breath and not vomiting      Timothy PaganiniJonathan A [name] is a 35 y.o. male  who presents to the Emergency Department complaining of central to left-sided chest pain that began just prior to arrival while he was outside moving a tree out of his yard. Patient states pain lasted about 3-5 minutes and resolved before EMS arrived. He was given 324 ASA and 1 nitro by EMS. He states he was pain-free before meds were given and has not experienced any pain since arrival. Patient endorses dizziness along which episode which he described as blurred vision and feeling as if he needed to sit down. Also had associated right arm tingling. Patient states he has a history of "heart problems" with similar pain however he has never experienced tingling down his arm before which prompted him to seek evaluation. When asked about his heart history, he states he had a cardiac workup including echo and monitor and was told he had intermittent PVC's and to follow up as needed. Last follow up was around Oct. 2016 where he was told everything was going well. Not a smoker.   History reviewed. No pertinent past medical history. History reviewed. No pertinent past surgical history. Family History  Problem Relation Age of Onset  . Diabetes Mother   . Hypertension Mother   . Diabetes Sister    Social History  Substance  Use Topics  . Smoking status: Never Smoker   . Smokeless tobacco: Never Used  . Alcohol Use: None    Review of Systems  Constitutional: Negative for fever and diaphoresis.  HENT: Negative for congestion.   Eyes: Positive for visual disturbance (Blurred vision, resolved in approx. 3 minutes).  Respiratory: Negative for cough and shortness of breath.   Cardiovascular: Positive for chest pain and palpitations. Negative for leg swelling.  Gastrointestinal: Negative for nausea, vomiting and abdominal pain.  Genitourinary: Negative for dysuria.  Musculoskeletal: Negative for back pain and neck pain.  Skin: Negative for rash.  Neurological: Positive for dizziness, light-headedness and numbness. Negative for syncope and headaches.      Allergies  Amoxicillin  Home Medications   Prior to Admission medications   Medication Sig Start Date End Date Taking? Authorizing Provider  Multiple Vitamin (MULTIVITAMIN) tablet Take 1 tablet by mouth daily.   Yes Historical Provider, MD  valACYclovir (VALTREX) 500 MG tablet Take 500 mg by mouth daily as needed (for outbreaks).  01/01/15  Yes Historical Provider, MD   BP 131/74 mmHg  Pulse 96  Temp(Src) 98.5 F (36.9 C) (Oral)  Resp 16  Wt 167.377 kg  SpO2 97% Physical Exam  Constitutional: He is oriented to person, place, and time. He appears well-developed and well-nourished.  Alert and in no acute distress  HENT:  Head: Normocephalic and atraumatic.  Cardiovascular: Normal heart sounds and intact distal pulses.  Exam reveals no gallop and no friction rub.   No murmur heard.  Mildly tachy but regular on exam  Pulmonary/Chest: Effort normal and breath sounds normal. No respiratory distress. He has no wheezes. He has no rales. He exhibits no tenderness.  Abdominal: Soft. He exhibits no distension. There is no tenderness.  Musculoskeletal: He exhibits no edema.  Neurological: He is alert and oriented to person, place, and time.  Alert,  oriented, thought content appropriate, able to give a coherent history. Speech is clear and goal oriented, able to follow commands.  Cranial Nerves II-XII grossly intact.  5/5 muscle strength in upper and lower extremities bilaterally including strong and equal grip strength and dorsiflexion/plantar flexion Sensory to light touch normal in all four extremities.  Normal finger-to-nose and rapid alternating movements.  Skin: Skin is warm and dry. He is not diaphoretic.  Nursing note and vitals reviewed.   ED Course  Procedures (including critical care time) Labs Review Labs Reviewed  COMPREHENSIVE METABOLIC PANEL - Abnormal; Notable for the following:    Glucose, Bld 117 (*)    Total Bilirubin 0.2 (*)    All other components within normal limits  CBC WITH DIFFERENTIAL/PLATELET  TROPONIN I  LIPID PANEL  I-STAT TROPOININ, ED    Imaging Review Dg Chest 2 View  03/30/2016  CLINICAL DATA:  Pt was doing yard work when he began having a strange pain in his arm. 324 aspirin and 1 nitro given by EMS; pain remained unchanged. BP dropped. Patient states he has this pain twice a week with activity. EXAM: CHEST  2 VIEW COMPARISON:  08/20/2005 FINDINGS: Normal mediastinum and cardiac silhouette. Normal pulmonary vasculature. No evidence of effusion, infiltrate, or pneumothorax. No acute bony abnormality. IMPRESSION: No acute cardiopulmonary process. Electronically Signed   By: Timothy [name]  Timothy M.D.   On: 03/30/2016 14:50   I have personally reviewed and evaluated these images and lab results as part of my medical decision-making.   EKG Interpretation   Date/Time:  Saturday March 30 2016 13:54:24 EDT Ventricular Rate:  100 PR Interval:    QRS Duration: 84 QT Interval:  336 QTC Calculation: 434 R Axis:   63 Text Interpretation:  Sinus tachycardia Borderline T abnormalities,  inferior leads Abnormal ekg Confirmed by BEATON  MD, Timothy ([id number]) on  03/30/2016 2:36:29 PM      MDM   Final  diagnoses:  Chest pain, unspecified chest pain type   Timothy [name] presents to ED for chest pain. Heart score of 5. EKG with t wave changes that were not present on last EKG in Dec. 2010. ASA and nitro given in route by EMS. Patient is pain free at this time. First trop negative. All other labs and CXR reviewed and reassuring. Cardiology consulted: case discussed and they reviewed EKG. Recommend admission to medicine with cardiology consultation. Possible stress test tomorrow if needed. Hospitalist consulted who will admit.   Patient discussed with Dr. Radford PaxBeaton who agrees with treatment plan.   [facility] Hospital[facility]ime Pilcher Cort Dragoo, PA-C 03/30/16 1547  Nelva Nayobert Beaton, MD 03/31/16 [id number]

## 2016-03-30 NOTE — ED Notes (Signed)
Pt outside doing yard work and got chest pain. No syncope. Cardiac work up last year. Patient is obese.

## 2016-03-31 ENCOUNTER — Other Ambulatory Visit: Payer: Self-pay

## 2016-03-31 ENCOUNTER — Other Ambulatory Visit: Payer: Self-pay | Admitting: Physician Assistant

## 2016-03-31 DIAGNOSIS — R0789 Other chest pain: Secondary | ICD-10-CM | POA: Diagnosis not present

## 2016-03-31 DIAGNOSIS — R079 Chest pain, unspecified: Secondary | ICD-10-CM | POA: Diagnosis not present

## 2016-03-31 DIAGNOSIS — R9431 Abnormal electrocardiogram [ECG] [EKG]: Secondary | ICD-10-CM | POA: Diagnosis not present

## 2016-03-31 LAB — TROPONIN I

## 2016-03-31 MED ORDER — NITROGLYCERIN 0.4 MG SL SUBL: 0.4000 mg | SUBLINGUAL_TABLET | SUBLINGUAL | Status: DC | PRN

## 2016-03-31 NOTE — Discharge Summary (Signed)
Physician Discharge Summary  Timothy [name] [id number] DOB: April 14, 1981 DOA: 03/30/2016  PCP: Boneta LucksJennifer Brown, NP  Admit date: 03/30/2016 Discharge date: 03/31/2016  Admitted From: Home Disposition:  Home  Recommendations for Outpatient Follow-up:  1. Follow up with PCP in 1-2 weeks 2. Please follow up with cardiology for stress testing.   Discharge Condition: Stable  CODE STATUS: Full Diet recommendation: Heart Healthy   Brief/Interim Summary: HPI: Timothy [name] is a 35 y.o. male with no significant past medical history resents to the emergency department with the chief complaint of chest pain. Initial evaluation does reveal an abnormal EKG and patient is evaluated by cardiology who recommend admitting for rule out  Information is obtained from the patient he reports episode of chest pain while moving 3 lens this morning. He describes the pain as a "tightness" located left chest radiating out into his right arm. He denies any headache dizziness diaphoresis nausea or vomiting. He denies any palpitations. He denies a numbness or tingling of his upper extremities. He reports the episode lasted 4-5 minutes. He denies any abdominal pain nausea vomiting diarrhea constipation. He denies any dysuria hematuria frequency or urgency. He denies headache visual disturbances syncope or near-syncope. He denies any lower extremity edema orthopnea.  Chest Pain: Atypical r/o given size would need 2 day myovue Does not need to stay in hospital for this. Ok for primary  Service to d/c will arrange 2 day exercise myovue and cardiology f/u.  Will send home with script for SL nitro.   Discharge Diagnoses:  Principal Problem:   Abnormal EKG Active Problems:   Chest pain   Obesity  Discharge Instructions  Discharge Instructions    Diet - low sodium heart healthy    Complete by:  As directed      Increase activity slowly    Complete by:  As directed             Medication List    TAKE  these medications        multivitamin tablet  Take 1 tablet by mouth daily.     nitroGLYCERIN 0.4 MG SL tablet  Commonly known as:  NITROSTAT  Place 1 tablet (0.4 mg total) under the tongue every 5 (five) minutes as needed for chest pain.     valACYclovir 500 MG tablet  Commonly known as:  VALTREX  Take 500 mg by mouth daily as needed (for outbreaks).           Follow-up Information    Follow up with Charlton HawsPeter Nishan, MD.   Specialty:  Cardiology   Why:  The office will call you to make an appoinment., If you do not hear from them, please contact them., You should be seen after your stress test.   Contact information:   [address] [place] [address] [phone]       Follow up with Boneta LucksJennifer Brown, NP In 1 week.   Specialty:  Nurse Practitioner   Why:  Hospital Follow Up   Contact information:   [address] [place] [address] [phone]      Allergies  Allergen Reactions  . Amoxicillin Rash   Consultations:  cardiology  Procedures/Studies: Dg Chest 2 View  03/30/2016  CLINICAL DATA:  Pt was doing yard work when he began having a strange pain in his arm. 324 aspirin and 1 nitro given by EMS; pain remained unchanged. BP dropped. Patient states he has this pain twice a week with activity. EXAM:  CHEST  2 VIEW COMPARISON:  08/20/2005 FINDINGS: Normal mediastinum and cardiac silhouette. Normal pulmonary vasculature. No evidence of effusion, infiltrate, or pneumothorax. No acute bony abnormality. IMPRESSION: No acute cardiopulmonary process. Electronically Signed   By: Genevive BiStewart  Edmunds M.D.   On: 03/30/2016 14:50     Subjective: Pt feels better, no chest pain, no SOB, ok to go home.   Discharge Exam: Filed Vitals:   03/31/16 0413 03/31/16 0838  BP: 136/57 121/70  Pulse: 65 73  Temp: 97.6 F (36.4 C) 98 F (36.7 C)  Resp: 16 16   Filed Vitals:   03/30/16 2015 03/30/16 2303 03/31/16 0413 03/31/16 0838  BP: 136/71 144/73 136/57  121/70  Pulse: 80 70 65 73  Temp: 97.9 F (36.6 C) 98 F (36.7 C) 97.6 F (36.4 C) 98 F (36.7 C)  TempSrc: Oral Oral Oral Oral  Resp: 16 16 16 16   Weight:   392 lb (177.81 kg)   SpO2: 99% 99% 99% 100%   General: Pt is alert, awake, not in acute distress Cardiovascular: RRR, S1/S2 +, no rubs, no gallops Respiratory: CTA bilaterally, no wheezing, no rhonchi Abdominal: Soft, NT, ND, bowel sounds + Extremities: no edema, no cyanosis   The results of significant diagnostics from this hospitalization (including imaging, microbiology, ancillary and laboratory) are listed below for reference.    Microbiology: No results found for this or any previous visit (from the past 240 hour(s)).   Labs: BNP (last 3 results) No results for input(s): BNP in the last 8760 hours. Basic Metabolic Panel:  Recent Labs Lab 03/30/16 1444  NA 141  K 3.8  CL 109  CO2 26  GLUCOSE 117*  BUN 8  CREATININE 0.98  CALCIUM 9.2   Liver Function Tests:  Recent Labs Lab 03/30/16 1444  AST 26  ALT 24  ALKPHOS 48  BILITOT 0.2*  PROT 6.5  ALBUMIN 3.6   No results for input(s): LIPASE, AMYLASE in the last 168 hours. No results for input(s): AMMONIA in the last 168 hours. CBC:  Recent Labs Lab 03/30/16 1444  WBC 6.0  NEUTROABS 3.3  HGB 13.6  HCT 39.5  MCV 86.2  PLT 218   Cardiac Enzymes:  Recent Labs Lab 03/30/16 2008 03/31/16 0540  TROPONINI <0.03 <0.03   BNP: Invalid input(s): POCBNP CBG: No results for input(s): GLUCAP in the last 168 hours. D-Dimer No results for input(s): DDIMER in the last 72 hours. Hgb A1c No results for input(s): HGBA1C in the last 72 hours. Lipid Profile  Recent Labs  03/30/16 1718  CHOL 115  HDL 35*  LDLCALC 22  TRIG 161288*  CHOLHDL 3.3   Thyroid function studies No results for input(s): TSH, T4TOTAL, T3FREE, THYROIDAB in the last 72 hours.  Invalid input(s): FREET3 Anemia work up No results for input(s): VITAMINB12, FOLATE, FERRITIN,  TIBC, IRON, RETICCTPCT in the last 72 hours. Urinalysis No results found for: COLORURINE, APPEARANCEUR, LABSPEC, PHURINE, GLUCOSEU, HGBUR, BILIRUBINUR, KETONESUR, PROTEINUR, UROBILINOGEN, NITRITE, LEUKOCYTESUR Sepsis Labs Invalid input(s): PROCALCITONIN,  WBC,  LACTICIDVEN Microbiology No results found for this or any previous visit (from the past 240 hour(s)).  Time coordinating discharge: 24 minutes  SIGNED:  Standley Dakinslanford Turkessa Ostrom, MD  [facility] 03/31/2016, 11:50 AM Pager   If 7PM-7AM, please contact night-coverage [email or web address] Password [email or web address]

## 2016-03-31 NOTE — Progress Notes (Signed)
Patient ID: Timothy [name], male   DOB: 1981/09/25, 35 y.o.   MRN: [id number]    Subjective:  Denies SSCP, palpitations or Dyspnea   Objective:  Filed Vitals:   03/30/16 2015 03/30/16 2303 03/31/16 0413 03/31/16 0838  BP: 136/71 144/73 136/57 121/70  Pulse: 80 70 65 73  Temp: 97.9 F (36.6 C) 98 F (36.7 C) 97.6 F (36.4 C) 98 F (36.7 C)  TempSrc: Oral Oral Oral Oral  Resp: 16 16 16 16   Weight:   392 lb (177.81 kg)   SpO2: 99% 99% 99% 100%    Intake/Output from previous day: No intake or output data in the 24 hours ending 03/31/16 [id number]  Physical Exam: Affect appropriate Overweight black male  HEENT: normal Neck supple with no adenopathy JVP normal no bruits no thyromegaly Lungs clear with no wheezing and good diaphragmatic motion Heart:  S1/S2 no murmur, no rub, gallop or click PMI normal Abdomen: benighn, BS positve, no tenderness, no AAA no bruit.  No HSM or HJR Distal pulses intact with no bruits No edema Neuro non-focal Skin warm and dry No muscular weakness   Lab Results: Basic Metabolic Panel:  Recent Labs  11/91/4706/24/17 1444  NA 141  K 3.8  CL 109  CO2 26  GLUCOSE 117*  BUN 8  CREATININE 0.98  CALCIUM 9.2   Liver Function Tests:  Recent Labs  03/30/16 1444  AST 26  ALT 24  ALKPHOS 48  BILITOT 0.2*  PROT 6.5  ALBUMIN 3.6   CBC:  Recent Labs  03/30/16 1444  WBC 6.0  NEUTROABS 3.3  HGB 13.6  HCT 39.5  MCV 86.2  PLT 218   Cardiac Enzymes:  Recent Labs  03/30/16 2008 03/31/16 0540  TROPONINI <0.03 <0.03   Fasting Lipid Panel:  Recent Labs  03/30/16 1718  CHOL 115  HDL 35*  LDLCALC 22  TRIG [id number]*  CHOLHDL 3.3    Imaging: Dg Chest 2 View  03/30/2016  CLINICAL DATA:  Pt was doing yard work when he began having a strange pain in his arm. 324 aspirin and 1 nitro given by EMS; pain remained unchanged. BP dropped. Patient states he has this pain twice a week with activity. EXAM: CHEST  2 VIEW COMPARISON:  08/20/2005  FINDINGS: Normal mediastinum and cardiac silhouette. Normal pulmonary vasculature. No evidence of effusion, infiltrate, or pneumothorax. No acute bony abnormality. IMPRESSION: No acute cardiopulmonary process. Electronically Signed   By: Genevive BiStewart  Edmunds M.D.   On: 03/30/2016 14:50    Cardiac Studies:  ECG: SR T inversion lead 3 otherwise normal    Telemetry:  NSR no acute chagnes   Echo:   Medications:   . aspirin  325 mg Oral Daily  . enoxaparin (LOVENOX) injection  40 mg Subcutaneous Q24H       Assessment/Plan:  Chest Pain:  Atypical r/o given size would need 2 day myovue Does not need to stay in hospital for this. Ok for primary  Service to d/c will arrange 2 day exercise myovue and f/u with Dr Wyline MoodBranch or myself.  ASA would send with script for SL nitro.    Timothy [name] 03/31/2016, 9:22 AM

## 2016-04-01 LAB — HEMOGLOBIN A1C
HEMOGLOBIN A1C: 6.1 % — AB (ref 4.8–5.6)
MEAN PLASMA GLUCOSE: 128 mg/dL

## 2016-04-24 ENCOUNTER — Telehealth: Payer: Self-pay | Admitting: *Deleted

## 2016-04-24 NOTE — Telephone Encounter (Signed)
We have left Timothy [name]'s several message to return our call to schedule his echocardiogram.

## 2016-05-26 NOTE — Progress Notes (Signed)
Cardiology Office Note   Date:  05/29/2016   ID:  Timothy [name], DOB [birth year], MRN [id number]  PCP:  Boneta LucksJennifer Brown, NP  Cardiologist:   Charlton HawsPeter Sadi Arave, MD   Chief Complaint  Patient presents with  . Establish Care    ED f/u, had CP but has since resolved per pt      History of Present Illness: Timothy [name] is a 35 y.o. male who presents for evaluation of chest pain Seen in ER 03/30/16  Family history of CAD.  Moving trees at home. Precordial pain radiating to right arm Spontaneous resolution  Episode lasted 4-5 minutes No associated muscular symptoms , dyspnea, palpitations or syncope. R/O CXR NAD.  ECG no acute changes  Seen by Dr Wyline MoodBranch who elicited history of exertional tightness last few months mostly at gym while on elliptical.  D/C home will need 2 day myovue given weight D/C with SL nitro     Past Medical History:  Diagnosis Date  . Exertional chest pain   . Obesity     History reviewed. No pertinent surgical history.   Current Outpatient Prescriptions  Medication Sig Dispense Refill  . Multiple Vitamin (MULTIVITAMIN) tablet Take 1 tablet by mouth daily.    . nitroGLYCERIN (NITROSTAT) 0.4 MG SL tablet Place 1 tablet (0.4 mg total) under the tongue every 5 (five) minutes as needed for chest pain. 15 tablet 0  . valACYclovir (VALTREX) 500 MG tablet Take 500 mg by mouth daily as needed (for outbreaks).   11   No current facility-administered medications for this visit.     Allergies:   Amoxicillin    Social History:  The patient  reports that he has never smoked. He has never used smokeless tobacco.   Family History:  The patient's family history includes Diabetes in his mother and sister; Hypertension in his mother.    ROS:  Please see the history of present illness.   Otherwise, review of systems are positive for none.   All other systems are reviewed and negative.    PHYSICAL EXAM: VS:  BP 96/60 (BP Location: Right Arm, Patient Position: Sitting,  Cuff Size: Large)   Pulse 70   Ht 6\' 1"  (1.854 m)   Wt (!) 394 lb 6.4 oz (178.9 kg)   SpO2 96%   BMI 52.03 kg/m  , BMI Body mass index is 52.03 kg/m. Affect appropriate Healthy:  appears stated age HEENT: normal Neck supple with no adenopathy JVP normal no bruits no thyromegaly Lungs clear with no wheezing and good diaphragmatic motion Heart:  S1/S2 no murmur, no rub, gallop or click PMI normal Abdomen: benighn, BS positve, no tenderness, no AAA no bruit.  No HSM or HJR Distal pulses intact with no bruits No edema Neuro non-focal Skin warm and dry No muscular weakness    EKG:   SR T wave inversion lead 3 otherwise normal   Recent Labs: 03/30/2016: ALT 24; BUN 8; Creatinine, Ser 0.98; Hemoglobin 13.6; Platelets 218; Potassium 3.8; Sodium 141    Lipid Panel    Component Value Date/Time   CHOL 115 03/30/2016 1718   TRIG 288 (H) 03/30/2016 1718   HDL 35 (L) 03/30/2016 1718   CHOLHDL 3.3 03/30/2016 1718   VLDL 58 (H) 03/30/2016 1718   LDLCALC 22 03/30/2016 1718      Wt Readings from Last 3 Encounters:  05/29/16 (!) 394 lb 6.4 oz (178.9 kg)  03/31/16 (!) 392 lb (177.8 kg)  07/28/15 (!) 388 lb  9.6 oz (176.3 kg)      Other studies Reviewed: Additional studies/ records that were reviewed today include: ER Notes labs ecg and Dr Wyline MoodBranch consult note .    ASSESSMENT AND PLAN:  1.  Chest Pain Atypical but needs exercise program f/u ETT myovue likely 2 day 2. Obesity discussed diet and exercise has gained over 100 lbs over last 2 years     Current medicines are reviewed at length with the patient today.  The patient does not have concerns regarding medicines.  The following changes have been made:  no change  Labs/ tests ordered today include:    Orders Placed This Encounter  Procedures  . Myocardial Perfusion Imaging     Disposition:   FU with in a year     Signed, Charlton HawsPeter Zechariah Bissonnette, MD  05/29/2016 8:42 AM    [facility] [address], [place], [place]  [address] Phone: [phone]; Fax: [phone]

## 2016-05-28 ENCOUNTER — Encounter: Payer: Self-pay | Admitting: Cardiovascular Disease

## 2016-05-29 ENCOUNTER — Encounter: Payer: Self-pay | Admitting: Cardiovascular Disease

## 2016-05-29 ENCOUNTER — Ambulatory Visit (INDEPENDENT_AMBULATORY_CARE_PROVIDER_SITE_OTHER): Payer: BC Managed Care – PPO | Admitting: Cardiovascular Disease

## 2016-05-29 ENCOUNTER — Encounter (INDEPENDENT_AMBULATORY_CARE_PROVIDER_SITE_OTHER): Payer: Self-pay

## 2016-05-29 VITALS — BP 96/60 | HR 70 | Ht 73.0 in | Wt 394.4 lb

## 2016-05-29 DIAGNOSIS — Z7189 Other specified counseling: Secondary | ICD-10-CM | POA: Diagnosis not present

## 2016-05-29 DIAGNOSIS — Z7689 Persons encountering health services in other specified circumstances: Secondary | ICD-10-CM

## 2016-05-29 DIAGNOSIS — R0789 Other chest pain: Secondary | ICD-10-CM | POA: Diagnosis not present

## 2016-05-29 NOTE — Patient Instructions (Signed)

## 2016-06-20 ENCOUNTER — Telehealth (HOSPITAL_COMMUNITY): Payer: Self-pay | Admitting: *Deleted

## 2016-06-20 NOTE — Telephone Encounter (Signed)
Left message on voicemail per DPR in reference to upcoming appointment scheduled on  06/24/16 with detailed instructions given per Myocardial Perfusion Study Information Sheet for the test. LM to arrive 15 minutes early, and that it is imperative to arrive on time for appointment to keep from having the test rescheduled. If you need to cancel or reschedule your appointment, please call the office within 24 hours of your appointment. Failure to do so may result in a cancellation of your appointment, and a $50 no show fee. Phone number given for call back for any questions. Chandy Tarman Jacqueline    

## 2016-06-24 ENCOUNTER — Ambulatory Visit (HOSPITAL_COMMUNITY): Payer: BC Managed Care – PPO

## 2016-06-25 ENCOUNTER — Ambulatory Visit (HOSPITAL_COMMUNITY): Payer: BC Managed Care – PPO

## 2016-08-14 ENCOUNTER — Telehealth (HOSPITAL_COMMUNITY): Payer: Self-pay | Admitting: *Deleted

## 2016-08-14 NOTE — Telephone Encounter (Signed)
Left message on voicemail per DPR in reference to upcoming appointment scheduled on 08/19/16 at 1230 with detailed instructions given per Myocardial Perfusion Study Information Sheet for the test. LM to arrive 15 minutes early, and that it is imperative to arrive on time for appointment to keep from having the test rescheduled. If you need to cancel or reschedule your appointment, please call the office within 24 hours of your appointment. Failure to do so may result in a cancellation of your appointment, and a $50 no show fee. Phone number given for call back for any questions.

## 2016-08-19 ENCOUNTER — Ambulatory Visit (HOSPITAL_COMMUNITY): Payer: BC Managed Care – PPO | Attending: Cardiology

## 2016-08-19 DIAGNOSIS — R0789 Other chest pain: Secondary | ICD-10-CM | POA: Diagnosis not present

## 2016-08-19 DIAGNOSIS — R9439 Abnormal result of other cardiovascular function study: Secondary | ICD-10-CM | POA: Diagnosis not present

## 2016-08-19 DIAGNOSIS — Z7689 Persons encountering health services in other specified circumstances: Secondary | ICD-10-CM | POA: Insufficient documentation

## 2016-08-19 MED ORDER — TECHNETIUM TC 99M TETROFOSMIN IV KIT
33.0000 | PACK | Freq: Once | INTRAVENOUS | Status: AC | PRN
  Administered 2016-08-19: 33 via INTRAVENOUS
  Filled 2016-08-19: qty 33

## 2016-08-20 ENCOUNTER — Ambulatory Visit (HOSPITAL_COMMUNITY): Payer: BC Managed Care – PPO | Attending: Cardiovascular Disease

## 2016-08-20 LAB — MYOCARDIAL PERFUSION IMAGING
CHL CUP MPHR: 186 {beats}/min
CHL CUP NUCLEAR SDS: 1
CHL CUP RESTING HR STRESS: 82 {beats}/min
CSEPED: 7 min
Estimated workload: 8.4 METS
Exercise duration (sec): 0 s
LHR: 0.35
LVDIAVOL: 149 mL (ref 62–150)
LVSYSVOL: 68 mL
NUC STRESS TID: 0.94
Peak HR: 162 {beats}/min
Percent HR: 87 %
SRS: 2
SSS: 3

## 2016-08-20 MED ORDER — TECHNETIUM TC 99M TETROFOSMIN IV KIT
32.6000 | PACK | Freq: Once | INTRAVENOUS | Status: AC | PRN
  Administered 2016-08-20: 32.6 via INTRAVENOUS
  Filled 2016-08-20: qty 33

## 2016-09-02 ENCOUNTER — Telehealth: Payer: Self-pay | Admitting: Endocrinology

## 2016-09-02 NOTE — Telephone Encounter (Signed)
Patient need a refill of low testosterone medication  Clomid.  [id number] - [place], [place] - [address] [facility] [address] [phone] (Phone) [phone] (Fax)

## 2016-09-04 ENCOUNTER — Other Ambulatory Visit: Payer: Self-pay | Admitting: Endocrinology

## 2016-09-05 ENCOUNTER — Other Ambulatory Visit: Payer: Self-pay

## 2016-09-05 NOTE — Telephone Encounter (Signed)
He will have a refill when he makes an appointment for follow-up, has not been seen for a year

## 2016-09-06 NOTE — Telephone Encounter (Signed)
Left a voice mail with the instructions and requested a call back to schedule a follow up

## 2016-11-04 ENCOUNTER — Ambulatory Visit: Payer: BC Managed Care – PPO | Admitting: Endocrinology

## 2016-11-04 ENCOUNTER — Telehealth: Payer: Self-pay | Admitting: Endocrinology

## 2016-11-04 NOTE — Telephone Encounter (Signed)
Patient no showed today's appt. Please advise on how to follow up. °A. No follow up necessary. °B. Follow up urgent. Contact patient immediately. °C. Follow up necessary. Contact patient and schedule visit in ___ days. °D. Follow up advised. Contact patient and schedule visit in ____weeks. ° °

## 2016-11-05 NOTE — Telephone Encounter (Signed)
Need to schedule follow-up within the next month If he is not able to maintain regular follow-up will need to consider dismissal

## 2016-11-07 NOTE — Telephone Encounter (Signed)
LM for pt to call to schedule.

## 2016-11-15 ENCOUNTER — Ambulatory Visit: Payer: BC Managed Care – PPO | Admitting: Endocrinology

## 2016-11-18 ENCOUNTER — Ambulatory Visit (INDEPENDENT_AMBULATORY_CARE_PROVIDER_SITE_OTHER): Payer: BC Managed Care – PPO | Admitting: Endocrinology

## 2016-11-18 VITALS — BP 118/88 | HR 97 | Resp 18 | Wt 391.0 lb

## 2016-11-18 DIAGNOSIS — R7301 Impaired fasting glucose: Secondary | ICD-10-CM | POA: Diagnosis not present

## 2016-11-18 DIAGNOSIS — E291 Testicular hypofunction: Secondary | ICD-10-CM

## 2016-11-18 DIAGNOSIS — R5383 Other fatigue: Secondary | ICD-10-CM

## 2016-11-18 LAB — T4, FREE: Free T4: 0.68 ng/dL (ref 0.60–1.60)

## 2016-11-18 LAB — LUTEINIZING HORMONE: LH: 6.86 m[IU]/mL (ref 1.50–9.30)

## 2016-11-18 LAB — HEMOGLOBIN A1C: HEMOGLOBIN A1C: 6.4 % (ref 4.6–6.5)

## 2016-11-18 LAB — GLUCOSE, RANDOM: GLUCOSE: 123 mg/dL — AB (ref 70–99)

## 2016-11-18 NOTE — Progress Notes (Signed)
Patient ID: Timothy [name], male   DOB: 03-29-1981, 36 y.o.   MRN: [id number]          Chief complaint: Low testosterone follow-up  History of Present Illness  HypogonadismWas diagnosed in 12/2014  He had complaints ofsignificant fatigue, decreased motivation, significantly decreased libido and breast enlargement prior to his evaluation The symptoms started in 2015 at the end of summer   Prior lab results show baselinetestosterone level of 229 drawn at 10 AM in 12/2014 His evaluation initially showed the following labs Free testosterone level was 6.5, prolactin 9.6, LH 3.2  Because of his desire to maintain his fertility he had been  on clomiphene half tablet 3 times a week after his initial visit. With this treatment he  had progressive improvement in his energy level as well as libido  Although his testosterone level had been back to normal in 7/16 subsequently it was low because of his missing a few doses of medication  RECENT history:  He has not been seen in follow-up for well over a year He ran out of his medication sometime after his last visit and has not taken it at all More recently has had increasing fatigue, increased motivation and lack of libido He thinks his breast enlargement is still persistent but this is nothing new for him and did not improve with clomiphene   Lab Results  Component Value Date   TESTOSTERONE 282.94 (L) 07/21/2015    OBESITY: He is doing exercise Regularly He says he was given phentermine by a weight-loss physician in [place] recently   Wt Readings from Last 3 Encounters:  11/18/16 (!) 391 lb (177.4 kg)  08/19/16 (!) 394 lb (178.7 kg)  05/29/16 (!) 394 lb 6.4 oz (178.9 kg)       Allergies as of 11/18/2016      Reactions   Amoxicillin Rash      Medication List       Accurate as of 11/18/16  9:11 AM. Always use your most recent med list.          multivitamin tablet Take 1 tablet by mouth daily.   nitroGLYCERIN  0.4 MG SL tablet Commonly known as:  NITROSTAT Place 1 tablet (0.4 mg total) under the tongue every 5 (five) minutes as needed for chest pain.   valACYclovir 500 MG tablet Commonly known as:  VALTREX Take 500 mg by mouth daily as needed (for outbreaks).       Allergies:  Allergies  Allergen Reactions  . Amoxicillin Rash    Past Medical History:  Diagnosis Date  . Exertional chest pain   . Obesity     No past surgical history on file.  Family History  Problem Relation Age of Onset  . Diabetes Mother   . Hypertension Mother   . Diabetes Sister     Social History:  reports that he has never smoked. He has never used smokeless tobacco. His alcohol and drug histories are not on file.  ROS    History of significant snoring and has been recommended sleep study, Still has not done this Does not think he has daytime somnolence however  He has not had any problems with high blood pressure but his reading is high now  BP Readings from Last 3 Encounters:  11/18/16 118/88  05/29/16 96/60  03/31/16 121/70     General Examination:   BP 118/88 (BP Location: Left Arm, Patient Position: Sitting, Cuff Size: Large)   Pulse 97   Resp 18  Wt (!) 391 lb (177.4 kg)   SpO2 99%   BMI 51.59 kg/m    Assessment/ Plan:  1. 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  Morbid obesity: He has not been able to lose significant amount of weight on his own Currently is trying to exercise However he is starting to take PHENTERMINE from a weight loss clinic Discussed  that this is causing significantly higher but pressure and pulse rate and will be unsafe for him to take He would probably benefit from other approved prescription drugs for long-term use He will discuss stopping this with his prescribing physician today  He also needs to have his sleep study done as he may need treatment for this  3.  History of prediabetes: Will need follow-up fasting glucose and A1c today  Total visit time for evaluation and management of multiple problems, review of labs, prior records, counseling = 25 minutes  Kongmeng Santoro 11/18/2016, 9:11 AM

## 2016-11-19 LAB — TESTOSTERONE, FREE, TOTAL, SHBG
Sex Hormone Binding: 25.2 nmol/L (ref 16.5–55.9)
Testosterone, Free: 10.8 pg/mL (ref 8.7–25.1)
Testosterone: 229 ng/dL — ABNORMAL LOW (ref 264–916)

## 2016-11-21 ENCOUNTER — Telehealth: Payer: Self-pay | Admitting: Endocrinology

## 2016-11-21 NOTE — Telephone Encounter (Signed)
Pt called in inquiring about the lab results and the medication that would be following the results, please advise. CB# [phone]

## 2016-11-26 NOTE — Telephone Encounter (Signed)
Patient calling about lab results, so he can get his medication straighten out. Please advise

## 2016-11-27 NOTE — Telephone Encounter (Signed)
The testosterone level is low normal, we can try the clomiphene again, he will need to take a half of a 50 mg tablet 3 times a week and follow-up with labs in 2 months

## 2016-11-27 NOTE — Telephone Encounter (Signed)
Please review the labs thank you!

## 2016-11-28 ENCOUNTER — Other Ambulatory Visit: Payer: Self-pay

## 2016-11-28 MED ORDER — CLOMIPHENE CITRATE 50 MG PO TABS: ORAL_TABLET | ORAL | 3 refills | Status: DC

## 2016-11-28 NOTE — Telephone Encounter (Signed)
Ordered 11/28/16 

## 2016-11-28 NOTE — Telephone Encounter (Signed)
Pt is fine with starting the clomiphene please call into [facility] on [address]

## 2017-01-13 ENCOUNTER — Other Ambulatory Visit: Payer: BC Managed Care – PPO

## 2017-01-15 NOTE — Progress Notes (Signed)
Erroneous encounter  ROS     This encounter was created in error - please disregard.

## 2017-01-16 ENCOUNTER — Encounter: Payer: BC Managed Care – PPO | Admitting: Endocrinology

## 2017-01-16 ENCOUNTER — Telehealth: Payer: Self-pay | Admitting: Endocrinology

## 2017-01-16 NOTE — Telephone Encounter (Signed)
Patient no showed today's appt. Please advise on how to follow up. °A. No follow up necessary. °B. Follow up urgent. Contact patient immediately. °C. Follow up necessary. Contact patient and schedule visit in ___ days. °D. Follow up advised. Contact patient and schedule visit in ____weeks. ° °

## 2017-01-17 NOTE — Telephone Encounter (Signed)
He was supposed to have labs and follow-up after starting clomiphene, please reschedule for days

## 2017-01-22 NOTE — Telephone Encounter (Signed)
LM for pt to call back to schedule °

## 2017-01-28 NOTE — Telephone Encounter (Signed)
LM for pt to call back to schedule °

## 2017-02-12 ENCOUNTER — Other Ambulatory Visit: Payer: Self-pay | Admitting: Endocrinology

## 2017-03-18 ENCOUNTER — Other Ambulatory Visit (HOSPITAL_COMMUNITY): Payer: Self-pay | Admitting: General Surgery

## 2017-03-20 ENCOUNTER — Ambulatory Visit (INDEPENDENT_AMBULATORY_CARE_PROVIDER_SITE_OTHER): Payer: BC Managed Care – PPO | Admitting: Neurology

## 2017-03-20 ENCOUNTER — Encounter: Payer: Self-pay | Admitting: Neurology

## 2017-03-20 VITALS — BP 140/90 | HR 76 | Ht 73.0 in | Wt >= 6400 oz

## 2017-03-20 DIAGNOSIS — Z820 Family history of epilepsy and other diseases of the nervous system: Secondary | ICD-10-CM

## 2017-03-20 DIAGNOSIS — R609 Edema, unspecified: Secondary | ICD-10-CM

## 2017-03-20 DIAGNOSIS — R0683 Snoring: Secondary | ICD-10-CM | POA: Diagnosis not present

## 2017-03-20 DIAGNOSIS — R0681 Apnea, not elsewhere classified: Secondary | ICD-10-CM

## 2017-03-20 DIAGNOSIS — Z6841 Body Mass Index (BMI) 40.0 and over, adult: Secondary | ICD-10-CM

## 2017-03-20 NOTE — Patient Instructions (Signed)

## 2017-03-20 NOTE — Progress Notes (Signed)
Subjective:    Patient ID: Timothy PaganiniJonathan A [name] is a 36 y.o. male.  HPI     Huston FoleySaima Unnamed Hino, MD, PhD [facility] [address], [address] [address] [place], [place] [address]  Dear Dr. Sheliah HatchKinsinger,   I saw your patient, Timothy DoingJonathan [name], upon your kind request in my neurologic clinic today for initial consultation of his sleep disorder, in particular, concern for underlying obstructive sleep apnea. The patient is unaccompanied today. As you know, Timothy [name] is a 36 year old right-handed gentleman with an underlying medical history of impaired fasting glucose, hypogonadism, and morbid obesity with a BMI of over 50, who reports snoring and excessive daytime somnolence. I reviewed your office note from 03/14/2017. He is being considered for sleeve gastrectomy. His Epworth sleepiness score is 6 out of 24, fatigue score is 25 out of 63. He lives at home with his wife, he has 1 child, he is a nonsmoker and drinks alcohol occasionally, once or twice a week and does not drink caffeine daily, usually once or twice a week. His wife has rarely noticed breathing pauses while he is asleep, he has rarely woken himself up with a sense of gasping for air. He denies morning headaches or night to night nocturia. His mother has sleep apnea and uses a CPAP machine. He would consider using CPAP if necessary. He denies restless leg symptoms or twitching or kicking in his sleep. He works as a IT sales professionalelementary school principal. He has a 302-1/37-year-old daughter. He has a bedtime of around 10 PM, wakeup time typically around 5:30 AM.  Her Past Medical History Is Significant For: Past Medical History:  Diagnosis Date  . Exertional chest pain   . Obesity     His Past Surgical History Is Significant For: No past surgical history on file.  His Family History Is Significant For: Family History  Problem Relation Age of Onset  . Diabetes Mother   . Hypertension Mother   . Diabetes Sister     His Social  History Is Significant For: Social History   Social History  . Marital status: Married    Spouse name: N/A  . Number of children: N/A  . Years of education: N/A   Social History Main Topics  . Smoking status: Never Smoker  . Smokeless tobacco: Never Used  . Alcohol use None  . Drug use: Unknown  . Sexual activity: Not Asked   Other Topics Concern  . None   Social History Narrative  . None    His Allergies Are:  Allergies  Allergen Reactions  . Amoxicillin Rash  :   His Current Medications Are:  Outpatient Encounter Prescriptions as of 03/20/2017  Medication Sig  . clomiPHENE (CLOMID) 50 MG tablet TAKE 1 TABLET 3 TIMES WEEKLY *WAITING ON PA**  . Multiple Vitamin (MULTIVITAMIN) tablet Take 1 tablet by mouth daily.  . valACYclovir (VALTREX) 500 MG tablet Take 500 mg by mouth daily as needed (for outbreaks).   . [DISCONTINUED] nitroGLYCERIN (NITROSTAT) 0.4 MG SL tablet Place 1 tablet (0.4 mg total) under the tongue every 5 (five) minutes as needed for chest pain. (Patient not taking: Reported on 11/18/2016)   No facility-administered encounter medications on file as of 03/20/2017.   :  Review of Systems:  Out of a complete 14 point review of systems, all are reviewed and negative with the exception of these symptoms as listed below: Review of Systems  Neurological:       Pt presents today to discuss his sleep.  Pt is a candidate for weight loss surgery. Pt has never had a sleep study but does endorse snoring.  Epworth Sleepiness Scale 0= would never doze 1= slight chance of dozing 2= moderate chance of dozing 3= high chance of dozing  Sitting and reading: 1 Watching TV: 1 Sitting inactive in a public place (ex. Theater or meeting): 1 As a passenger in a car for an hour without a break: 1 Lying down to rest in the afternoon: 2 Sitting and talking to someone: 0 Sitting quietly after lunch (no alcohol): 0 In a car, while stopped in traffic: 0 Total: 6    Objective:   Neurologic Exam  Physical Exam Physical Examination:   Vitals:   03/20/17 0935  BP: 140/90  Pulse: 76    General Examination: The patient is a very pleasant 36 y.o. male in no acute distress. He appears well-developed and well-nourished and well groomed.   HEENT: Normocephalic, atraumatic, pupils are equal, round and reactive to light and accommodation. Funduscopic exam is normal with sharp disc margins noted. Extraocular tracking is good without limitation to gaze excursion or nystagmus noted. Normal smooth pursuit is noted. Hearing is grossly intact. Tympanic membranes are clear bilaterally. Face is symmetric with normal facial animation and normal facial sensation. Speech is clear with no dysarthria noted. There is no hypophonia. There is no lip, neck/head, jaw or voice tremor. Neck is supple with full range of passive and active motion. There are no carotid bruits on auscultation. Oropharynx exam reveals: mild mouth dryness, good dental hygiene and moderate airway crowding, due to smaller airway entry, tonsils are small, but uvula is larger, cannot fully visualized tip of uvula. Mallampati is class II. Tongue protrudes centrally and palate elevates symmetrically. Tonsils are about 1+ in size/absent. Neck size is 18 7/8 inches. He has a nearly absent overbite. Nasal inspection reveals no significant nasal mucosal bogginess or redness and no septal deviation.   Chest: Clear to auscultation without wheezing, rhonchi or crackles noted.  Heart: S1+S2+0, regular and normal without murmurs, rubs or gallops noted.   Abdomen: Soft, non-tender and non-distended with normal bowel sounds appreciated on auscultation.  Extremities: There is trace pitting edema in the distal lower extremities bilaterally. Pedal pulses are intact.  Skin: Warm and dry without trophic changes noted. There are no varicose veins.  Musculoskeletal: exam reveals no obvious joint deformities, tenderness or joint swelling or  erythema.   Neurologically:  Mental status: The patient is awake, alert and oriented in all 4 spheres. His immediate and remote memory, attention, language skills and fund of knowledge are appropriate. There is no evidence of aphasia, agnosia, apraxia or anomia. Speech is clear with normal prosody and enunciation. Thought process is linear. Mood is normal and affect is normal.  Cranial nerves II - XII are as described above under HEENT exam. In addition: shoulder shrug is normal with equal shoulder height noted. Motor exam: Normal bulk, strength and tone is noted. There is no drift, tremor or rebound. Romberg is negative. Reflexes are 2+ throughout. Babinski: Toes are flexor bilaterally. Fine motor skills and coordination: intact with normal finger taps, normal hand movements, normal rapid alternating patting, normal foot taps and normal foot agility.  Cerebellar testing: No dysmetria or intention tremor on finger to nose testing. Heel to shin is difficult d/t body habitus. There is no truncal or gait ataxia.  Sensory exam: intact to light touch in the upper and lower extremities.  Gait, station and balance: He stands easily. No  veering to one side is noted. No leaning to one side is noted. Posture is age-appropriate and stance is narrow based. Gait shows normal stride length and normal pace. No problems turning are noted. Tandem walk is unremarkable. Intact toe and heel stance is noted.               Assessment and Plan:   In summary, Timothy [name] is a very pleasant 36 y.o.-year old male with an underlying medical history of impaired fasting glucose, hypogonadism, and morbid obesity with a BMI of over 50, whose history and physical exam are concerning for obstructive sleep apnea (OSA). In light of upcoming bariatric surgery planned for 05/05/2017, sleep study testing is indicated and justified with some urgency. I had a long chat with the patient about my findings and the diagnosis of OSA, its  prognosis and treatment options. We talked about medical treatments, surgical interventions and non-pharmacological approaches. I explained in particular the risks and ramifications of untreated moderate to severe OSA, especially with respect to developing cardiovascular disease down the Road, including congestive heart failure, difficult to treat hypertension, cardiac arrhythmias, or stroke. Even type 2 diabetes has, in part, been linked to untreated OSA. Symptoms of untreated OSA include daytime sleepiness, memory problems, mood irritability and mood disorder such as depression and anxiety, lack of energy, as well as recurrent headaches, especially morning headaches. We talked about trying to maintain a healthy lifestyle in general, as well as the importance of weight control. I encouraged the patient to eat healthy, exercise daily and keep well hydrated, to keep a scheduled bedtime and wake time routine, to not skip any meals and eat healthy snacks in between meals. I advised the patient not to drive when feeling sleepy. I recommended the following at this time: sleep study with potential positive airway pressure titration. (We will score hypopneas at 3%).   I explained the sleep test procedure to the patient and also outlined possible surgical and non-surgical treatment options of OSA, including the use of a custom-made dental device (which would require a referral to a specialist dentist or oral surgeon), upper airway surgical options, such as pillar implants, radiofrequency surgery, tongue base surgery, and UPPP (which would involve a referral to an ENT surgeon). Rarely, jaw surgery such as mandibular advancement may be considered.  I also explained the CPAP treatment option to the patient, who indicated that he would be willing to try CPAP if the need arises. I explained the importance of being compliant with PAP treatment, not only for insurance purposes but primarily to improve His symptoms, and for the  patient's long term health benefit, including to reduce His cardiovascular risks. I answered all his questions today and the patient was in agreement. I would like to see him back after the sleep study is completed and encouraged him to call with any interim questions, concerns, problems or updates.   Thank you very much for allowing me to participate in the care of this nice patient. If I can be of any further assistance to you please do not hesitate to call me at [phone].  Sincerely,   Huston Foley, MD, PhD

## 2017-03-24 ENCOUNTER — Encounter: Payer: BC Managed Care – PPO | Attending: Internal Medicine | Admitting: Registered"

## 2017-03-24 ENCOUNTER — Encounter: Payer: Self-pay | Admitting: Registered"

## 2017-03-24 DIAGNOSIS — Z713 Dietary counseling and surveillance: Secondary | ICD-10-CM | POA: Diagnosis not present

## 2017-03-24 DIAGNOSIS — E669 Obesity, unspecified: Secondary | ICD-10-CM

## 2017-03-24 NOTE — Progress Notes (Signed)
Pre-Op Assessment Visit:  Pre-Operative Sleeve Gastrectomy Surgery  Medical Nutrition Therapy:  Appt start time: 10:45  End time:  11:35  Patient was seen on 03/24/2017 for Pre-Operative Nutrition Assessment. Assessment and [facility] coordinator on 03/24/2017.   Pt expectation of surgery: improve back and knees pain, be more physically active  Pt expectation of Dietitian: assistance in learning on what to eat post-op  Start weight at [facility]: 402.4 BMI: 54.58   Pt states he doesn't eat beef or pork. Pt states [place] in [place] and works in [place].   Per insurance, pt needs 0 SWL visits prior to surgery.    24 hr Dietary Recall: First Meal: Belvita breakfast bar, banana Snack: trail mix, chips, nabs Second Meal: fast food Snack: fruit cup, jello, yogurt Third Meal: pasta, chicken and vegetables Snack: sometimes ice cream, freeze pop Beverages: diet soda, simply lemonade, green tea, crystal light flavored water  Encouraged to engage in 150 minutes of moderate physical activity including cardiovascular and weight baring weekly  Handouts given during visit include:  . Pre-Op Goals . Bariatric Surgery Protein Shakes . Vitamin and Mineral Recommendations  During the appointment today the following Pre-Op Goals were reviewed with the patient: . Maintain or lose weight as instructed by your surgeon . Make healthy food choices . Begin to limit portion sizes . Limited concentrated sugars and fried foods . Keep fat/sugar in the single digits per serving on          food labels . Practice CHEWING your food  (aim for 30 chews per bite or until applesauce consistency) . Practice not drinking 15 minutes before, during, and 30 minutes after each meal/snack . Avoid all carbonated beverages  . Avoid/limit caffeinated beverages  . Avoid all sugar-sweetened beverages . Consume 3 meals per day; eat every 3-5  hours . Make a list of non-food related activities . Aim for 64-100 ounces of FLUID daily  . Aim for at least 60-80 grams of PROTEIN daily . Look for a liquid protein source that contain ?15 g protein and ?5 g carbohydrate  (ex: shakes, drinks, shots) . Physical activity is an important part of a healthy lifestyle so keep it moving!  Follow diet recommendations listed below Energy and Macronutrient Recommendations: Calories: 2000 Carbohydrate: 225 Protein: 150 Fat: 56  Demonstrated degree of understanding via:  Teach Back   Teaching Method Utilized:  Visual Auditory Hands on  Barriers to learning/adherence to lifestyle change: none  Patient to call the [facility] to enroll in Pre-Op and Post-Op Nutrition Education when surgery date is scheduled.

## 2017-03-27 ENCOUNTER — Ambulatory Visit (HOSPITAL_COMMUNITY)
Admission: RE | Admit: 2017-03-27 | Discharge: 2017-03-27 | Disposition: A | Discharge status: Home / Self Care | Payer: BC Managed Care – PPO | Source: Ambulatory Visit | Attending: General Surgery | Admitting: General Surgery

## 2017-03-27 ENCOUNTER — Other Ambulatory Visit: Payer: Self-pay

## 2017-03-27 DIAGNOSIS — Z01818 Encounter for other preprocedural examination: Secondary | ICD-10-CM | POA: Insufficient documentation

## 2017-03-27 DIAGNOSIS — Z01810 Encounter for preprocedural cardiovascular examination: Secondary | ICD-10-CM | POA: Diagnosis present

## 2017-03-31 ENCOUNTER — Encounter: Payer: BC Managed Care – PPO | Admitting: Skilled Nursing Facility1

## 2017-03-31 DIAGNOSIS — E669 Obesity, unspecified: Secondary | ICD-10-CM

## 2017-04-02 ENCOUNTER — Ambulatory Visit (INDEPENDENT_AMBULATORY_CARE_PROVIDER_SITE_OTHER): Payer: BC Managed Care – PPO | Admitting: Neurology

## 2017-04-02 DIAGNOSIS — R0683 Snoring: Secondary | ICD-10-CM

## 2017-04-02 DIAGNOSIS — G4733 Obstructive sleep apnea (adult) (pediatric): Secondary | ICD-10-CM | POA: Diagnosis not present

## 2017-04-02 NOTE — Progress Notes (Signed)
  Pre-Operative Nutrition Class:  Appt start time: 4514   End time:  1830.  Patient was seen on 6/25 for Pre-Operative Bariatric Surgery Education [facility].   Surgery date:  Surgery type: Sleeve Gastrectomy Start weight at [facility]: 402.4 Weight today: Pt denied  TANITA  BODY COMP RESULTS     BMI (kg/m^2)    Fat Mass (lbs)    Fat Free Mass (lbs)    Total Body Water (lbs)    Samples given per MNT protocol. Patient educated on appropriate usage: Bariatric Advantage Multivitamin Lot # [id number] Exp: 06/19  Bariatric Fusion Calcium Citrate Lot # [id number] Exp: 02/05/18  Premier Clear Protein Drink Lot # [id number] Exp: 09/02/17  The following the learning objectives were met by the patient during this course:  Identify Pre-Op Dietary Goals and will begin 2 weeks pre-operatively  Identify appropriate sources of fluids and proteins   State protein recommendations and appropriate sources pre and post-operatively  Identify Post-Operative Dietary Goals and will follow for 2 weeks post-operatively  Identify appropriate multivitamin and calcium sources  Describe the need for physical activity post-operatively and will follow MD recommendations  State when to call healthcare provider regarding medication questions or post-operative complications  Handouts given during class include:  Pre-Op Bariatric Surgery Diet Handout  Protein Shake Handout  Post-Op Bariatric Surgery Nutrition Handout  [facility] Program Information Flyer  Support Group Information Flyer  [facility] Bariatric Supplements Price List  Follow-Up Plan: Patient will follow-up at [facility] 2 weeks post operatively for diet advancement per MD.

## 2017-04-11 ENCOUNTER — Telehealth: Payer: Self-pay | Admitting: Neurology

## 2017-04-11 DIAGNOSIS — G4733 Obstructive sleep apnea (adult) (pediatric): Secondary | ICD-10-CM

## 2017-04-11 NOTE — Progress Notes (Signed)
Patient referred by Dr. Sheliah HatchKinsinger, with surgery, seen by me on 03/20/17, diagnostic PSG on 04/02/17.     Please call and notify the patient that the recent sleep study did confirm the diagnosis of obstructive sleep apnea. OSA is overall mild, severe in REM sleep with a total AHI of 7.5/hour, REM AHI of 34.4/hour, supine AHI of 10.7/hour, and O2 nadir of 83%. Given the patient's medical history and upcoming elective weight loss surgery, treatment with positive airway pressure is recommended. This can be achieved with autoPAP at home To that end, I recommend treatment for this in the form of autoPAP, which means, that we don't have to bring him back for a second sleep study with CPAP, but will let him try an autoPAP machine at home, through a DME company (of his choice, or as per insurance requirement). The DME representative will educate him on how to use the machine, how to put the mask on, etc. I have placed an order in the chart. Please send referral, talk to patient, send report to PCP and referring MD. We will need a FU in sleep clinic for 10 weeks post-PAP set up, please arrange that as well. Thanks,   Huston FoleySaima Anes Rigel, MD, PhD [facility]

## 2017-04-11 NOTE — Telephone Encounter (Signed)
Please see result note for PSG from 04/02/17, which I signed before putting autoPAP order in.

## 2017-04-11 NOTE — Procedures (Signed)
PATIENT'S NAME:  Timothy Timothy [name], Timothy Timothy [name] DOB:      1980-11-15      MR#:    [id number]     DATE OF RECORDING: 04/02/2017 REFERRING M.D.:  Feliciana RossettiLuke Kinsinger, MD Study Performed:   Baseline Polysomnogram HISTORY: 36 year old man with an underlying medical history of impaired fasting glucose, hypogonadism, and morbid obesity, who reports snoring and excessive daytime somnolence. He is being evaluated for weight loss surgery. The patient endorsed the Epworth Sleepiness Scale at 6 points. The patient's weight 401 pounds with Timothy [name] height of 71 (inches), resulting in Timothy [name] BMI of 56.2 kg/m2. The patient's neck circumference measured 18.8 inches.  CURRENT MEDICATIONS: Clomid, Multivitamin, Valtrex   PROCEDURE:  This is Timothy [name] multichannel digital polysomnogram utilizing the Somnostar 11.2 system.  Electrodes and sensors were applied and monitored per AASM Specifications.   EEG, EOG, Chin and Limb EMG, were sampled at 200 Hz.  ECG, Snore and Nasal Pressure, Thermal Airflow, Respiratory Effort, CPAP Flow and Pressure, Oximetry was sampled at 50 Hz. Digital video and audio were recorded.      BASELINE STUDY  Lights Out was at 21:12 and Lights On at 05:01.  Total recording time (TRT) was 469 minutes, with Timothy [name] total sleep time (TST) of  399.5 minutes.  The patient's sleep latency was 61 minutes, which delayed. REM latency was 71.5 minutes, which is normal. The sleep efficiency was 85.2 %.     SLEEP ARCHITECTURE: WASO (Wake after sleep onset) was 8.5 minutes. There were 10 minutes in Stage N1, 237 minutes Stage N2, 74 minutes Stage N3 and 78.5 minutes in Stage REM. The percentage of Stage N1 was 2.5%, Stage N2 was 59.3%, which is increased, Stage N3 was 18.5%, which is normal, and Stage R (REM sleep) was 19.6%, which is normal.  The arousals were noted as: 41 were spontaneous, 0 were associated with PLMs, 26 were associated with respiratory events.    Audio and video analysis did not show any abnormal or unusual movements, behaviors,  phonations or vocalizations.  The patient took no bathroom breaks. Mild to moderate snoring was noted.The EKG was in keeping with normal sinus rhythm (NSR).  RESPIRATORY ANALYSIS:  There were Timothy [name] total of 50 respiratory events:  4 obstructive apneas, 0 central apneas and 0 mixed apneas with Timothy [name] total of 4 apneas and an apnea index (AI) of .6 /hour. There were 46 hypopneas with Timothy [name] hypopnea index of 6.9 /hour. The patient also had 0 respiratory event related arousals (RERAs).      The total APNEA/HYPOPNEA INDEX (AHI) was 7.5/hour and the total RESPIRATORY DISTURBANCE INDEX was 7.5 /hour.  45 events occurred in REM sleep and 8 events in NREM. The REM AHI was 34.4 /hour, versus Timothy [name] non-REM AHI of .9. The patient spent 270.5 minutes of total sleep time in the supine position and 129 minutes in non-supine. The supine AHI was 10.7 versus Timothy [name] non-supine AHI of 0.9.  OXYGEN SATURATION & C02:  The Wake baseline 02 saturation was 94%, with the lowest being 83%. Time spent below 89% saturation equaled 2 minutes.  PERIODIC LIMB MOVEMENTS: The patient had Timothy [name] total of 0 Periodic Limb Movements.  The Periodic Limb Movement (PLM) index was 0 and the PLM Arousal index was 0/hour.  Post-study, the patient indicated that sleep was the same as usual.   IMPRESSION:  1. Obstructive Sleep Apnea (OSA)  RECOMMENDATIONS:  1. This study demonstrates overall mild obstructive sleep apnea, severe in REM sleep with Timothy [name] total AHI of 7.5/hour, REM  AHI of 34.4/hour, supine AHI of 10.7/hour, and O2 nadir of 83%. Given the patient's medical history and upcoming elective weight loss surgery, treatment with positive airway pressure is recommended. This can be achieved with autoPAP at home and will be discussed with the patient. Timothy [name] full-night CPAP titration study may help optimize therapy. Other treatment options for OSA (generally speaking) may include avoidance of supine sleep position along with weight loss, upper airway or jaw surgery in selected  patients or the use of an oral appliance in certain patients. ENT evaluation and/or consultation with Timothy [name] maxillofacial surgeon or dentist may be feasible in some instances.    2. Please note that untreated obstructive sleep apnea carries additional perioperative morbidity. Patients with significant obstructive sleep apnea should receive perioperative PAP therapy and the surgeons and particularly the anesthesiologist should be informed of the diagnosis and the severity of the sleep disordered breathing. 3. The patient should be cautioned not to drive, work at heights, or operate dangerous or heavy equipment when tired or sleepy. Review and reiteration of good sleep hygiene measures should be pursued with any patient. 4. The patient will be seen in follow-up by Dr. Frances Furbish at [facility] for discussion of the test results and further management strategies. The referring provider will be notified of the test results.  I certify that I have reviewed the entire raw data recording prior to the issuance of this report in accordance with the Standards of Accreditation of the American Academy of Sleep Medicine (AASM)   Huston Foley, MD, PhD Diplomat, American Board of Psychiatry and Neurology (Neurology and Sleep Medicine)

## 2017-04-14 ENCOUNTER — Telehealth: Payer: Self-pay

## 2017-04-14 NOTE — Telephone Encounter (Signed)
Patient returning a call regarding sleep results. °

## 2017-04-14 NOTE — Telephone Encounter (Signed)
-----   Message from Huston FoleySaima Athar, MD sent at 04/11/2017 10:08 AM EDT ----- Patient referred by Dr. Sheliah HatchKinsinger, with surgery, seen by me on 03/20/17, diagnostic PSG on 04/02/17.     Please call and notify the patient that the recent sleep study did confirm the diagnosis of obstructive sleep apnea. OSA is overall mild, severe in REM sleep with a total AHI of 7.5/hour, REM AHI of 34.4/hour, supine AHI of 10.7/hour, and O2 nadir of 83%. Given the patient's medical history and upcoming elective weight loss surgery, treatment with positive airway pressure is recommended. This can be achieved with autoPAP at home To that end, I recommend treatment for this in the form of autoPAP, which means, that we don't have to bring him back for a second sleep study with CPAP, but will let him try an autoPAP machine at home, through a DME company (of his choice, or as per insurance requirement). The DME representative will educate him on how to use the machine, how to put the mask on, etc. I have placed an order in the chart. Please send referral, talk to patient, send report to PCP and referring MD. We will need a FU in sleep clinic for 10 weeks post-PAP set up, please arrange that as well. Thanks,   Huston FoleySaima Athar, MD, PhD [facility]

## 2017-04-14 NOTE — Telephone Encounter (Signed)
I called pt. I advised pt that Dr. Frances FurbishAthar reviewed their sleep study results and found that pt pt has osa. Dr. Frances FurbishAthar recommends that pt start an auto pap. I reviewed PAP compliance expectations with the pt. Pt is agreeable to starting an auto PAP. I advised pt that an order will be sent to a DME, [facility], and [facility] will call the pt within about one week after they file with the pt's insurance. [facility] will show the pt how to use the machine, fit for masks, and troubleshoot the  Auto PAP if needed. A follow up appt was made for insurance purposes with Dr. Frances FurbishAthar on Wednesday, September 6th, 2018 at 8:30am. Pt verbalized understanding to arrive 15 minutes early and bring their auto PAP. A letter with all of this information in it will be mailed to the pt as a reminder. I verified with the pt that the address we have on file is correct. Pt verbalized understanding of results. Pt had no questions at this time but was encouraged to call back if questions arise.

## 2017-04-14 NOTE — Telephone Encounter (Signed)
I called pt to discuss his sleep study results. No answer, left a message asking him to call me back. 

## 2017-04-22 NOTE — Progress Notes (Signed)
Please place orders in EPIC as patient is being scheduled for a pre-op appointment! Thank you! 

## 2017-04-28 ENCOUNTER — Telehealth: Payer: Self-pay | Admitting: Neurology

## 2017-04-28 NOTE — Telephone Encounter (Signed)
Received this notice from [employer]: "I did receive this referral, I called him and left a Voicemail 04/14/2017.  I will call him again today"

## 2017-04-28 NOTE — Telephone Encounter (Signed)
I called pt, left a detailed message on [phone], per DPR, asking him to call [employer] at [phone] if he has not heard from them today.

## 2017-04-28 NOTE — Telephone Encounter (Signed)
Pt stated he has not heard anything about his APAP machine. Please on contact the pt.

## 2017-04-28 NOTE — Telephone Encounter (Signed)
I have reached out to [employer] to find out what is going on. 

## 2017-04-30 ENCOUNTER — Encounter (HOSPITAL_COMMUNITY): Payer: Self-pay

## 2017-04-30 NOTE — Progress Notes (Signed)
Please place orders in EPIC as patient has a pre-op appointment on 05/01/2017! Thank you!

## 2017-04-30 NOTE — Patient Instructions (Signed)
Timothy PaganiniJonathan A Fatima  04/30/2017   Your procedure is scheduled on: 05/05/17  [facility]  Entrance [place]  elevators to 3rd floor to  Short Stay Center at      1000 AM.     Call this number if you have problems the morning of surgery [phone]    Remember: ONLY 1 PERSON MAY GO WITH YOU TO SHORT STAY TO GET  READY MORNING OF YOUR SURGERY.  Do not eat food or drink liquids :After Midnight.     Take these medicines the morning of surgery with A SIP OF WATER: NONE                                You may not have any metal on your body including hair pins and              piercings  Do not wear jewelry,, lotions, powders or perfumes, deodorant                         Do not bring valuables to the hospital. [facility] IS NOT             RESPONSIBLE   FOR VALUABLES.  Contacts, dentures or bridgework may not be worn into surgery.  Leave suitcase in the car. After surgery it may be brought to your room.                  Please read over the following fact sheets you were given: _____________________________________________________________________             [facility] - Preparing for Surgery Before surgery, you can play an important role.  Because skin is not sterile, your skin needs to be as free of germs as possible.  You can reduce the number of germs on your skin by washing with CHG (chlorahexidine gluconate) soap before surgery.  CHG is an antiseptic cleaner which kills germs and bonds with the skin to continue killing germs even after washing. Please DO NOT use if you have an allergy to CHG or antibacterial soaps.  If your skin becomes reddened/irritated stop using the CHG and inform your nurse when you arrive at Short Stay. Do not shave (including legs and underarms) for at least 48 hours prior to the first CHG shower.  You may shave your face/neck. Please follow these instructions carefully:  1.  Shower with CHG Soap the night before  surgery and the  morning of Surgery.  2.  If you choose to wash your hair, wash your hair first as usual with your  normal  shampoo.  3.  After you shampoo, rinse your hair and body thoroughly to remove the  shampoo.                           4.  Use CHG as you would any other liquid soap.  You can apply chg directly  to the skin and wash                       Gently with a scrungie or clean washcloth.  5.  Apply the CHG Soap to your body ONLY FROM THE NECK DOWN.   Do not use on face/ open  Wound or open sores. Avoid contact with eyes, ears mouth and genitals (private parts).                       Wash face,  Genitals (private parts) with your normal soap.             6.  Wash thoroughly, paying special attention to the area where your surgery  will be performed.  7.  Thoroughly rinse your body with warm water from the neck down.  8.  DO NOT shower/wash with your normal soap after using and rinsing off  the CHG Soap.                9.  Pat yourself dry with a clean towel.            10.  Wear clean pajamas.            11.  Place clean sheets on your bed the night of your first shower and do not  sleep with pets. Day of Surgery : Do not apply any lotions/deodorants the morning of surgery.  Please wear clean clothes to the hospital/surgery center.  FAILURE TO FOLLOW THESE INSTRUCTIONS MAY RESULT IN THE CANCELLATION OF YOUR SURGERY PATIENT SIGNATURE_________________________________  NURSE SIGNATURE__________________________________  ________________________________________________________________________

## 2017-04-30 NOTE — Progress Notes (Signed)
Please place orders in epic pt. Has a preop appt 05/01/17 at 0900. Thank You!

## 2017-05-01 ENCOUNTER — Encounter (HOSPITAL_COMMUNITY): Payer: BC Managed Care – PPO

## 2017-05-01 ENCOUNTER — Encounter (HOSPITAL_COMMUNITY): Payer: Self-pay

## 2017-05-01 ENCOUNTER — Encounter (HOSPITAL_COMMUNITY)
Admission: RE | Admit: 2017-05-01 | Discharge: 2017-05-01 | Disposition: A | Discharge status: Home / Self Care | Payer: BC Managed Care – PPO | Source: Ambulatory Visit | Attending: General Surgery | Admitting: General Surgery

## 2017-05-01 DIAGNOSIS — Z6841 Body Mass Index (BMI) 40.0 and over, adult: Secondary | ICD-10-CM | POA: Diagnosis not present

## 2017-05-01 DIAGNOSIS — Z01812 Encounter for preprocedural laboratory examination: Secondary | ICD-10-CM | POA: Diagnosis not present

## 2017-05-01 HISTORY — DX: Sleep apnea, unspecified: G47.30

## 2017-05-01 LAB — BASIC METABOLIC PANEL
ANION GAP: 8 (ref 5–15)
BUN: 15 mg/dL (ref 6–20)
CO2: 25 mmol/L (ref 22–32)
Calcium: 9.3 mg/dL (ref 8.9–10.3)
Chloride: 106 mmol/L (ref 101–111)
Creatinine, Ser: 0.96 mg/dL (ref 0.61–1.24)
GFR calc Af Amer: >60 mL/min (ref >60)
GLUCOSE: 128 mg/dL — AB (ref 65–99)
POTASSIUM: 4.3 mmol/L (ref 3.5–5.1)
Sodium: 139 mmol/L (ref 135–145)

## 2017-05-01 LAB — CBC
HCT: 40.8 % (ref 39.0–52.0)
HEMOGLOBIN: 14.5 g/dL (ref 13.0–17.0)
MCH: 30.3 pg (ref 26.0–34.0)
MCHC: 35.5 g/dL (ref 30.0–36.0)
MCV: 85.4 fL (ref 78.0–100.0)
Platelets: 217 10*3/uL (ref 150–400)
RBC: 4.78 MIL/uL (ref 4.22–5.81)
RDW: 13.1 % (ref 11.5–15.5)
WBC: 5.7 10*3/uL (ref 4.0–10.5)

## 2017-05-01 NOTE — Progress Notes (Signed)
Pt. Has diagnosis of sleep apnea but will not get his equipment till after surgery

## 2017-05-01 NOTE — Progress Notes (Signed)
Called portable equipment and asked  for a bari med to be at short stay by 1000 am.

## 2017-05-01 NOTE — Progress Notes (Signed)
EKG 03/27/17 EPIC STRESS 08/20/17 EPIC CXR 03/27/17 EPIC

## 2017-05-05 ENCOUNTER — Inpatient Hospital Stay (HOSPITAL_COMMUNITY): Payer: BC Managed Care – PPO | Admitting: Registered Nurse

## 2017-05-05 ENCOUNTER — Encounter (HOSPITAL_COMMUNITY)
Admission: RE | Disposition: A | Discharge status: Home / Self Care | Payer: Self-pay | Source: Ambulatory Visit | Attending: General Surgery

## 2017-05-05 ENCOUNTER — Encounter (HOSPITAL_COMMUNITY): Payer: Self-pay | Admitting: *Deleted

## 2017-05-05 ENCOUNTER — Inpatient Hospital Stay (HOSPITAL_COMMUNITY)
Admission: RE | Admit: 2017-05-05 | Discharge: 2017-05-06 | DRG: 621 | Disposition: A | Discharge status: Home / Self Care | Payer: BC Managed Care – PPO | Source: Ambulatory Visit | Attending: General Surgery | Admitting: General Surgery

## 2017-05-05 DIAGNOSIS — G4730 Sleep apnea, unspecified: Secondary | ICD-10-CM | POA: Diagnosis present

## 2017-05-05 DIAGNOSIS — I10 Essential (primary) hypertension: Secondary | ICD-10-CM | POA: Diagnosis present

## 2017-05-05 DIAGNOSIS — Z6841 Body Mass Index (BMI) 40.0 and over, adult: Secondary | ICD-10-CM

## 2017-05-05 HISTORY — PX: LAPAROSCOPIC GASTRIC SLEEVE RESECTION: SHX5895

## 2017-05-05 LAB — HEMOGLOBIN AND HEMATOCRIT, BLOOD
HCT: 38.4 % — ABNORMAL LOW (ref 39.0–52.0)
Hemoglobin: 13.7 g/dL (ref 13.0–17.0)

## 2017-05-05 LAB — CBC
HEMATOCRIT: 38.1 % — AB (ref 39.0–52.0)
HEMOGLOBIN: 13.7 g/dL (ref 13.0–17.0)
MCH: 31 pg (ref 26.0–34.0)
MCHC: 36 g/dL (ref 30.0–36.0)
MCV: 86.2 fL (ref 78.0–100.0)
Platelets: 198 10*3/uL (ref 150–400)
RBC: 4.42 MIL/uL (ref 4.22–5.81)
RDW: 12.9 % (ref 11.5–15.5)
WBC: 9.2 10*3/uL (ref 4.0–10.5)

## 2017-05-05 LAB — CREATININE, SERUM: CREATININE: 1.02 mg/dL (ref 0.61–1.24)

## 2017-05-05 SURGERY — GASTRECTOMY, SLEEVE, LAPAROSCOPIC
Anesthesia: General

## 2017-05-05 MED ORDER — SUCCINYLCHOLINE CHLORIDE 200 MG/10ML IV SOSY
PREFILLED_SYRINGE | INTRAVENOUS | Status: AC
  Filled 2017-05-05: qty 10

## 2017-05-05 MED ORDER — PHENYLEPHRINE 40 MCG/ML (10ML) SYRINGE FOR IV PUSH (FOR BLOOD PRESSURE SUPPORT)
PREFILLED_SYRINGE | INTRAVENOUS | Status: AC
  Filled 2017-05-05: qty 10

## 2017-05-05 MED ORDER — CHLORHEXIDINE GLUCONATE 4 % EX LIQD: 60.0000 mL | Freq: Once | CUTANEOUS | Status: DC

## 2017-05-05 MED ORDER — FENTANYL CITRATE (PF) 100 MCG/2ML IJ SOLN
INTRAMUSCULAR | Status: DC | PRN
  Administered 2017-05-05 (×2): 50 ug via INTRAVENOUS

## 2017-05-05 MED ORDER — SCOPOLAMINE 1 MG/3DAYS TD PT72
1.0000 | MEDICATED_PATCH | TRANSDERMAL | Status: DC
  Administered 2017-05-05: 1.5 mg via TRANSDERMAL
  Filled 2017-05-05: qty 1

## 2017-05-05 MED ORDER — SUCCINYLCHOLINE CHLORIDE 200 MG/10ML IV SOSY
PREFILLED_SYRINGE | INTRAVENOUS | Status: DC | PRN
  Administered 2017-05-05: 100 mg via INTRAVENOUS

## 2017-05-05 MED ORDER — ACETAMINOPHEN 325 MG PO TABS: 650.0000 mg | ORAL_TABLET | ORAL | Status: DC | PRN

## 2017-05-05 MED ORDER — ACETAMINOPHEN 500 MG PO TABS
1000.0000 mg | ORAL_TABLET | ORAL | Status: AC
  Administered 2017-05-05: 1000 mg via ORAL
  Filled 2017-05-05: qty 2

## 2017-05-05 MED ORDER — ROCURONIUM BROMIDE 50 MG/5ML IV SOSY
PREFILLED_SYRINGE | INTRAVENOUS | Status: AC
  Filled 2017-05-05: qty 5

## 2017-05-05 MED ORDER — EPHEDRINE SULFATE-NACL 50-0.9 MG/10ML-% IV SOSY
PREFILLED_SYRINGE | INTRAVENOUS | Status: DC | PRN
  Administered 2017-05-05: 10 mg via INTRAVENOUS

## 2017-05-05 MED ORDER — SIMETHICONE 80 MG PO CHEW: 80.0000 mg | CHEWABLE_TABLET | Freq: Four times a day (QID) | ORAL | Status: DC | PRN

## 2017-05-05 MED ORDER — PROPOFOL 10 MG/ML IV BOLUS
INTRAVENOUS | Status: DC | PRN
  Administered 2017-05-05: 290 mg via INTRAVENOUS

## 2017-05-05 MED ORDER — LIDOCAINE 2% (20 MG/ML) 5 ML SYRINGE
INTRAMUSCULAR | Status: DC | PRN
  Administered 2017-05-05: 100 mg via INTRAVENOUS

## 2017-05-05 MED ORDER — CLINDAMYCIN PHOSPHATE 900 MG/50ML IV SOLN
INTRAVENOUS | Status: AC
  Filled 2017-05-05: qty 50

## 2017-05-05 MED ORDER — ONDANSETRON HCL 4 MG/2ML IJ SOLN: 4.0000 mg | INTRAMUSCULAR | Status: DC | PRN

## 2017-05-05 MED ORDER — SUGAMMADEX SODIUM 500 MG/5ML IV SOLN
INTRAVENOUS | Status: AC
  Filled 2017-05-05: qty 5

## 2017-05-05 MED ORDER — PROPOFOL 10 MG/ML IV BOLUS
INTRAVENOUS | Status: AC
  Filled 2017-05-05: qty 40

## 2017-05-05 MED ORDER — BUPIVACAINE HCL (PF) 0.25 % IJ SOLN
INTRAMUSCULAR | Status: AC
  Filled 2017-05-05: qty 30

## 2017-05-05 MED ORDER — PANTOPRAZOLE SODIUM 40 MG IV SOLR
40.0000 mg | Freq: Every day | INTRAVENOUS | Status: DC
  Administered 2017-05-05: 40 mg via INTRAVENOUS
  Filled 2017-05-05: qty 40

## 2017-05-05 MED ORDER — ONDANSETRON HCL 4 MG/2ML IJ SOLN
INTRAMUSCULAR | Status: DC | PRN
  Administered 2017-05-05: 4 mg via INTRAVENOUS

## 2017-05-05 MED ORDER — FENTANYL CITRATE (PF) 100 MCG/2ML IJ SOLN
25.0000 ug | INTRAMUSCULAR | Status: DC | PRN
  Administered 2017-05-05: 50 ug via INTRAVENOUS

## 2017-05-05 MED ORDER — FENTANYL CITRATE (PF) 100 MCG/2ML IJ SOLN
INTRAMUSCULAR | Status: AC
  Filled 2017-05-05: qty 2

## 2017-05-05 MED ORDER — MORPHINE SULFATE (PF) 2 MG/ML IV SOLN: 1.0000 mg | INTRAVENOUS | Status: DC | PRN

## 2017-05-05 MED ORDER — HYDRALAZINE HCL 20 MG/ML IJ SOLN
10.0000 mg | INTRAMUSCULAR | Status: DC | PRN
  Filled 2017-05-05: qty 0.5

## 2017-05-05 MED ORDER — MIDAZOLAM HCL 5 MG/5ML IJ SOLN
INTRAMUSCULAR | Status: DC | PRN
  Administered 2017-05-05: 2 mg via INTRAVENOUS

## 2017-05-05 MED ORDER — GABAPENTIN 300 MG PO CAPS
300.0000 mg | ORAL_CAPSULE | ORAL | Status: AC
  Administered 2017-05-05: 300 mg via ORAL
  Filled 2017-05-05: qty 1

## 2017-05-05 MED ORDER — LACTATED RINGERS IR SOLN
Status: DC | PRN
  Administered 2017-05-05: 600 mL

## 2017-05-05 MED ORDER — EPHEDRINE 5 MG/ML INJ
INTRAVENOUS | Status: AC
  Filled 2017-05-05: qty 10

## 2017-05-05 MED ORDER — APREPITANT 40 MG PO CAPS: 40.0000 mg | ORAL_CAPSULE | ORAL | Status: DC

## 2017-05-05 MED ORDER — ALBUMIN HUMAN 5 % IV SOLN
INTRAVENOUS | Status: DC | PRN
  Administered 2017-05-05: 13:00:00 via INTRAVENOUS

## 2017-05-05 MED ORDER — HEPARIN SODIUM (PORCINE) 5000 UNIT/ML IJ SOLN
5000.0000 [IU] | INTRAMUSCULAR | Status: AC
  Administered 2017-05-05: 5000 [IU] via SUBCUTANEOUS
  Filled 2017-05-05: qty 1

## 2017-05-05 MED ORDER — DEXAMETHASONE SODIUM PHOSPHATE 10 MG/ML IJ SOLN
INTRAMUSCULAR | Status: AC
  Filled 2017-05-05: qty 1

## 2017-05-05 MED ORDER — LIDOCAINE 2% (20 MG/ML) 5 ML SYRINGE
INTRAMUSCULAR | Status: AC
  Filled 2017-05-05: qty 5

## 2017-05-05 MED ORDER — DEXAMETHASONE SODIUM PHOSPHATE 4 MG/ML IJ SOLN: 4.0000 mg | INTRAMUSCULAR | Status: DC

## 2017-05-05 MED ORDER — DEXTROSE 5 % IV SOLN
INTRAVENOUS | Status: AC
  Administered 2017-05-05: 270 mL via INTRAVENOUS
  Filled 2017-05-05 (×2): qty 6.75

## 2017-05-05 MED ORDER — ROCURONIUM BROMIDE 10 MG/ML (PF) SYRINGE
PREFILLED_SYRINGE | INTRAVENOUS | Status: DC | PRN
  Administered 2017-05-05: 5 mg via INTRAVENOUS
  Administered 2017-05-05: 10 mg via INTRAVENOUS
  Administered 2017-05-05: 50 mg via INTRAVENOUS

## 2017-05-05 MED ORDER — SODIUM CHLORIDE 0.9 % IV SOLN
INTRAVENOUS | Status: DC
  Administered 2017-05-05: 17:00:00 via INTRAVENOUS
  Administered 2017-05-06: 1000 mL via INTRAVENOUS

## 2017-05-05 MED ORDER — BUPIVACAINE HCL 0.25 % IJ SOLN
INTRAMUSCULAR | Status: DC | PRN
  Administered 2017-05-05: 30 mL

## 2017-05-05 MED ORDER — ONDANSETRON HCL 4 MG/2ML IJ SOLN
INTRAMUSCULAR | Status: AC
  Filled 2017-05-05: qty 2

## 2017-05-05 MED ORDER — TRAMADOL HCL 50 MG PO TABS
50.0000 mg | ORAL_TABLET | Freq: Four times a day (QID) | ORAL | Status: DC | PRN
Start: 2017-05-05 — End: 2017-05-06

## 2017-05-05 MED ORDER — BUPIVACAINE LIPOSOME 1.3 % IJ SUSP
20.0000 mL | Freq: Once | INTRAMUSCULAR | Status: AC
  Administered 2017-05-05: 20 mL
  Filled 2017-05-05: qty 20

## 2017-05-05 MED ORDER — DEXAMETHASONE SODIUM PHOSPHATE 10 MG/ML IJ SOLN
INTRAMUSCULAR | Status: DC | PRN
  Administered 2017-05-05: 10 mg via INTRAVENOUS

## 2017-05-05 MED ORDER — MIDAZOLAM HCL 2 MG/2ML IJ SOLN
INTRAMUSCULAR | Status: AC
  Filled 2017-05-05: qty 2

## 2017-05-05 MED ORDER — SUGAMMADEX SODIUM 200 MG/2ML IV SOLN
INTRAVENOUS | Status: DC | PRN
  Administered 2017-05-05: 370 mg via INTRAVENOUS

## 2017-05-05 MED ORDER — LACTATED RINGERS IV SOLN
INTRAVENOUS | Status: DC
  Administered 2017-05-05: 11:00:00 via INTRAVENOUS

## 2017-05-05 MED ORDER — PREMIER PROTEIN SHAKE
2.0000 [oz_av] | ORAL | Status: DC
  Administered 2017-05-06 (×4): 2 [oz_av] via ORAL

## 2017-05-05 MED ORDER — ENOXAPARIN SODIUM 30 MG/0.3ML ~~LOC~~ SOLN
30.0000 mg | Freq: Two times a day (BID) | SUBCUTANEOUS | Status: DC
  Administered 2017-05-05 – 2017-05-06 (×2): 30 mg via SUBCUTANEOUS
  Filled 2017-05-05 (×2): qty 0.3

## 2017-05-05 MED ORDER — PHENYLEPHRINE 40 MCG/ML (10ML) SYRINGE FOR IV PUSH (FOR BLOOD PRESSURE SUPPORT)
PREFILLED_SYRINGE | INTRAVENOUS | Status: DC | PRN
  Administered 2017-05-05: 80 ug via INTRAVENOUS

## 2017-05-05 MED ORDER — APREPITANT 40 MG PO CAPS
40.0000 mg | ORAL_CAPSULE | ORAL | Status: AC
  Administered 2017-05-05: 40 mg via ORAL
  Filled 2017-05-05: qty 1

## 2017-05-05 MED ORDER — ACETAMINOPHEN 160 MG/5ML PO SOLN: 325.0000 mg | ORAL | Status: DC | PRN

## 2017-05-05 MED ORDER — OXYCODONE HCL 5 MG/5ML PO SOLN
5.0000 mg | ORAL | Status: DC | PRN
  Administered 2017-05-05 – 2017-05-06 (×2): 5 mg via ORAL
  Filled 2017-05-05 (×2): qty 5

## 2017-05-05 SURGICAL SUPPLY — 64 items
APL SKNCLS STERI-STRIP NONHPOA (GAUZE/BANDAGES/DRESSINGS) ×1
APPLIER CLIP 5 13 M/L LIGAMAX5 (MISCELLANEOUS)
APPLIER CLIP ROT 13.4 12 LRG (CLIP)
APR CLP LRG 13.4X12 ROT 20 MLT (CLIP)
APR CLP MED LRG 5 ANG JAW (MISCELLANEOUS)
BAG LAPAROSCOPIC 12 15 PORT 16 (BASKET) ×1 IMPLANT
BAG RETRIEVAL 12/15 (BASKET) ×2
BAG RETRIEVAL 12/15MM (BASKET) ×1
BANDAGE ADH SHEER 1  50/CT (GAUZE/BANDAGES/DRESSINGS) ×18 IMPLANT
BENZOIN TINCTURE PRP APPL 2/3 (GAUZE/BANDAGES/DRESSINGS) ×3 IMPLANT
BLADE SURG SZ11 CARB STEEL (BLADE) ×3 IMPLANT
CABLE HIGH FREQUENCY MONO STRZ (ELECTRODE) ×3 IMPLANT
CHLORAPREP W/TINT 26ML (MISCELLANEOUS) ×5 IMPLANT
CLIP APPLIE 5 13 M/L LIGAMAX5 (MISCELLANEOUS) IMPLANT
CLIP APPLIE ROT 13.4 12 LRG (CLIP) IMPLANT
CLOSURE WOUND 1/2 X4 (GAUZE/BANDAGES/DRESSINGS) ×1
COVER SURGICAL LIGHT HANDLE (MISCELLANEOUS) ×3 IMPLANT
DRAIN CHANNEL 19F RND (DRAIN) IMPLANT
ELECT REM PT RETURN 15FT ADLT (MISCELLANEOUS) ×3 IMPLANT
EVACUATOR SILICONE 100CC (DRAIN) IMPLANT
GAUZE SPONGE 4X4 12PLY STRL (GAUZE/BANDAGES/DRESSINGS) IMPLANT
GLOVE BIOGEL PI IND STRL 7.0 (GLOVE) ×1 IMPLANT
GLOVE BIOGEL PI INDICATOR 7.0 (GLOVE) ×2
GLOVE SURG SS PI 7.0 STRL IVOR (GLOVE) ×3 IMPLANT
GOWN STRL REUS W/TWL LRG LVL3 (GOWN DISPOSABLE) ×3 IMPLANT
GOWN STRL REUS W/TWL XL LVL3 (GOWN DISPOSABLE) ×9 IMPLANT
GRASPER SUT TROCAR 14GX15 (MISCELLANEOUS) ×3 IMPLANT
HANDLE STAPLE EGIA 4 XL (STAPLE) ×3 IMPLANT
HOVERMATT SINGLE USE (MISCELLANEOUS) ×3 IMPLANT
KIT BASIN OR (CUSTOM PROCEDURE TRAY) ×3 IMPLANT
MARKER SKIN DUAL TIP RULER LAB (MISCELLANEOUS) ×3 IMPLANT
NDL SPNL 22GX3.5 QUINCKE BK (NEEDLE) ×1 IMPLANT
NEEDLE SPNL 22GX3.5 QUINCKE BK (NEEDLE) ×3 IMPLANT
NS IRRIG 1000ML POUR BTL (IV SOLUTION) ×2 IMPLANT
PACK CARDIOVASCULAR III (CUSTOM PROCEDURE TRAY) ×3 IMPLANT
RELOAD EGIA 45 MED/THCK PURPLE (STAPLE) IMPLANT
RELOAD EGIA 60 MED/THCK PURPLE (STAPLE) ×9 IMPLANT
RELOAD STAPLE 60 BLK XTHK ART (STAPLE) IMPLANT
RELOAD STAPLE 60 MED/THCK ART (STAPLE) IMPLANT
RELOAD TRI 2.0 60 XTHK VAS SUL (STAPLE) ×6 IMPLANT
RELOAD TRI 60 ART MED THCK BLK (STAPLE) IMPLANT
RELOAD TRI 60 ART MED THCK PUR (STAPLE) IMPLANT
SCISSORS LAP 5X45 EPIX DISP (ENDOMECHANICALS) IMPLANT
SET IRRIG TUBING LAPAROSCOPIC (IRRIGATION / IRRIGATOR) ×3 IMPLANT
SHEARS HARMONIC ACE PLUS 45CM (MISCELLANEOUS) ×3 IMPLANT
SLEEVE GASTRECTOMY 40FR VISIGI (MISCELLANEOUS) ×3 IMPLANT
SLEEVE XCEL OPT CAN 5 100 (ENDOMECHANICALS) ×6 IMPLANT
SOLUTION ANTI FOG 6CC (MISCELLANEOUS) ×3 IMPLANT
SPONGE LAP 18X18 X RAY DECT (DISPOSABLE) ×3 IMPLANT
STRIP CLOSURE SKIN 1/2X4 (GAUZE/BANDAGES/DRESSINGS) ×2 IMPLANT
SUT ETHIBOND 0 36 GRN (SUTURE) IMPLANT
SUT ETHILON 2 0 PS N (SUTURE) IMPLANT
SUT MNCRL AB 4-0 PS2 18 (SUTURE) ×3 IMPLANT
SUT VICRYL 0 TIES 12 18 (SUTURE) ×3 IMPLANT
SYR 20CC LL (SYRINGE) ×3 IMPLANT
SYR 50ML LL SCALE MARK (SYRINGE) ×3 IMPLANT
TOWEL OR 17X26 10 PK STRL BLUE (TOWEL DISPOSABLE) ×3 IMPLANT
TOWEL OR NON WOVEN STRL DISP B (DISPOSABLE) ×3 IMPLANT
TROCAR BLADELESS 15MM (ENDOMECHANICALS) ×3 IMPLANT
TROCAR BLADELESS OPT 5 100 (ENDOMECHANICALS) ×3 IMPLANT
TUBING CONNECTING 10 (TUBING) ×2 IMPLANT
TUBING CONNECTING 10' (TUBING) ×1
TUBING ENDO SMARTCAP (MISCELLANEOUS) ×3 IMPLANT
TUBING INSUF HEATED (TUBING) ×3 IMPLANT

## 2017-05-05 NOTE — Transfer of Care (Signed)
Immediate Anesthesia Transfer of Care Note  Patient: Timothy [name]  Procedure(s) Performed: Procedure(s): LAPAROSCOPIC GASTRIC SLEEVE RESECTION WITH UPPER ENDO (N/A)  Patient Location: PACU  Anesthesia Type:General  Level of Consciousness: awake, alert , oriented and patient cooperative  Airway & Oxygen Therapy: Patient Spontanous Breathing and Patient connected to face mask oxygen  Post-op Assessment: Report given to RN, Post -op Vital signs reviewed and stable and Patient moving all extremities  Post vital signs: Reviewed and stable  Last Vitals:  Vitals:   05/05/17 1006  BP: (!) 142/69  Pulse: 82  Resp: 20  Temp: 36.8 C    Last Pain:  Vitals:   05/05/17 1006  TempSrc: Oral      Patients Stated Pain Goal: 3 (05/05/17 1054)  Complications: No apparent anesthesia complications

## 2017-05-05 NOTE — Interval H&P Note (Signed)
History and Physical Interval Note:  05/05/2017 12:47 PM  [name]  has presented today for surgery, with the diagnosis of MORBID OBESITY  The various methods of treatment have been discussed with the patient and family. After consideration of risks, benefits and other options for treatment, the patient has consented to  Procedure(s): LAPAROSCOPIC GASTRIC SLEEVE RESECTION WITH UPPER ENDO (N/A) as a surgical intervention .  The patient's history has been reviewed, patient examined, no change in status, stable for surgery.  I have reviewed the patient's chart and labs.  Questions were answered to the patient's satisfaction.     De BlanchLuke Aaron Charissa Knowles

## 2017-05-05 NOTE — Anesthesia Procedure Notes (Signed)
Procedure Name: Intubation Date/Time: 05/05/2017 1:01 PM Performed by: Carleene Cooper A Pre-anesthesia Checklist: Patient identified, Emergency Drugs available, Suction available, Patient being monitored and Timeout performed Patient Re-evaluated:Patient Re-evaluated prior to induction Oxygen Delivery Method: Circle system utilized Preoxygenation: Pre-oxygenation with 100% oxygen Induction Type: IV induction Ventilation: Mask ventilation without difficulty and Oral airway inserted - appropriate to patient size Laryngoscope Size: Mac and 4 Grade View: Grade I Tube type: Oral Tube size: 7.5 mm Number of attempts: 1 Airway Equipment and Method: Stylet Placement Confirmation: ETT inserted through vocal cords under direct vision,  positive ETCO2 and breath sounds checked- equal and bilateral Secured at: 22 cm Tube secured with: Tape Dental Injury: Teeth and Oropharynx as per pre-operative assessment

## 2017-05-05 NOTE — H&P (Signed)
History of Present Illness Timothy [name](Audie Stayer A. Ingris Pasquarella MD; 05/01/2017 4:49 PM) The patient is a 36 year old male who presents for a bariatric surgery evaluation. Patient has completed all necessary workup in the preoperative phase for bariatric surgery. The patient is now ready to proceed with surgery. The patient has made moderate improvements in her diet and is exercising well and has no new medical issues or new medications.    Allergies Christianne Dolin(Christen Lambert, RMA; 05/01/2017 2:52 PM) Amoxicillin *PENICILLINS*  Rash.  Medication History Christianne Dolin(Christen Lambert, [place]; 05/01/2017 2:54 PM) Multivitamin & Mineral (Oral) Active. ValACYclovir HCl (500MG  Tablet, Oral) Active. Medications Reconciled    Review of Systems Timothy [name](Daveon Arpino A. Uva Runkel MD; 05/01/2017 4:49 PM) General Not Present- Appetite Loss, Chills, Fatigue, Fever, Night Sweats, Weight Gain and Weight Loss. Skin Not Present- Change in Wart/Mole, Dryness, Hives, Jaundice, New Lesions, Non-Healing Wounds, Rash and Ulcer. HEENT Present- Seasonal Allergies. Not Present- Earache, Hearing Loss, Hoarseness, Nose Bleed, Oral Ulcers, Ringing in the Ears, Sinus Pain, Sore Throat, Visual Disturbances, Wears glasses/contact lenses and Yellow Eyes. Respiratory Not Present- Bloody sputum, Chronic Cough, Difficulty Breathing, Snoring and Wheezing. Breast Not Present- Breast Mass, Breast Pain, Nipple Discharge and Skin Changes. Cardiovascular Not Present- Chest Pain, Difficulty Breathing Lying Down, Leg Cramps, Palpitations, Rapid Heart Rate, Shortness of Breath and Swelling of Extremities. Gastrointestinal Not Present- Abdominal Pain, Bloating, Bloody Stool, Change in Bowel Habits, Chronic diarrhea, Constipation, Difficulty Swallowing, Excessive gas, Gets full quickly at meals, Hemorrhoids, Indigestion, Nausea, Rectal Pain and Vomiting. Male Genitourinary Not Present- Blood in Urine, Change in Urinary Stream, Frequency, Impotence, Nocturia, Painful Urination, Urgency and  Urine Leakage. Musculoskeletal Not Present- Back Pain, Joint Pain, Joint Stiffness, Muscle Pain, Muscle Weakness and Swelling of Extremities. Neurological Not Present- Decreased Memory, Fainting, Headaches, Numbness, Seizures, Tingling, Tremor, Trouble walking and Weakness. Psychiatric Not Present- Anxiety, Bipolar, Change in Sleep Pattern, Depression, Fearful and Frequent crying. Endocrine Not Present- Cold Intolerance, Excessive Hunger, Hair Changes, Heat Intolerance and New Diabetes. Hematology Not Present- Blood Thinners, Easy Bruising, Excessive bleeding, Gland problems, HIV and Persistent Infections.  Vitals Christianne Dolin(Christen Lambert RMA; 05/01/2017 2:55 PM) 05/01/2017 2:54 PM Weight: 391.8 lb Height: 73in Body Surface Area: 2.86 m Body Mass Index: 51.69 kg/m  Temp.: 97.90F  Pulse: 95 (Regular)  BP: 110/70 (Sitting, Left Arm, Standard)       Physical Exam Timothy [name](Cortney Beissel A. Reece Fehnel MD; 05/01/2017 4:50 PM) General Mental Status-Alert. General Appearance-Cooperative. Orientation-Oriented X4. Build & Nutrition-Obese. Posture-Normal posture.  Integumentary Global Assessment Upon inspection and palpation of skin surfaces of the - Head/Face: no rashes, ulcers, lesions or evidence of photo damage. No palpable nodules or masses and Neck: no visible lesions or palpable masses.  Head and Neck Head-normocephalic, atraumatic with no lesions or palpable masses. Face Global Assessment - atraumatic. Thyroid Gland Characteristics - normal size and consistency.  Eye Eyeball - Bilateral-Extraocular movements intact. Sclera/Conjunctiva - Bilateral-No scleral icterus, No Discharge.  ENMT Nose and Sinuses External Inspection of the Nose - no deformities observed, no swelling present.  Chest and Lung Exam Palpation Palpation of the chest reveals - Non-tender. Auscultation Breath sounds - Normal.  Cardiovascular Auscultation Rhythm - Regular. Heart Sounds - S1 WNL and  S2 WNL. Carotid arteries - No Carotid bruit.  Abdomen Inspection Inspection of the abdomen reveals - No Visible peristalsis, No Abnormal pulsations and No Paradoxical movements. Palpation/Percussion Palpation and Percussion of the abdomen reveal - Soft, Non Tender, No Rebound tenderness, No Rigidity (guarding), No hepatosplenomegaly and No Palpable abdominal masses.  Peripheral Vascular Upper  Extremity Palpation - Pulses bilaterally normal. Lower Extremity Palpation - Edema - Bilateral - No edema. Note: +peripheral edema   Neurologic Neurologic evaluation reveals -normal sensation and normal coordination.  Neuropsychiatric Mental status exam performed with findings of-able to articulate well with normal speech/language, rate, volume and coherence and thought content normal with ability to perform basic computations and apply abstract reasoning.  Musculoskeletal Normal Exam - Bilateral-Upper Extremity Strength Normal and Lower Extremity Strength Normal.    Assessment & Plan Timothy [name](Edker Punt A. Argil Mahl MD; 05/01/2017 4:50 PM) MORBID OBESITY (E66.01) Story: 36 yo male with morbid obesity. He was diagnosed with OSA by sleep study and is set to get his CPAP soon. He has completed all other requirements and is ready to proceed with lap sleeve gastrectomy Impression: We again discussed the details of the operation: Will be done under general anesthesia with an endotracheal tube, that it will be a laparoscopic procedure with 6 small incisions large one being 1.5-2in, that we will remove remove the short gastrics and other vessels to the greater curve of the stomach, place a large Bougie down the stomach and then remove a percent of the stomach using a linear stapler. We discussed the procedure will take approximately 1.5-2 hours. We discussed risks of VTE, staple line leak, skin infection, sleeve stenosis, incisional hernia, need for open procedure, and postoperative nausea and vomiting.

## 2017-05-05 NOTE — Anesthesia Postprocedure Evaluation (Signed)
Anesthesia Post Note  Patient: Rise PaganiniJonathan A Kirtley  Procedure(s) Performed: Procedure(s) (LRB): LAPAROSCOPIC GASTRIC SLEEVE RESECTION WITH UPPER ENDO (N/A)     Patient location during evaluation: PACU Anesthesia Type: General Level of consciousness: awake Pain management: pain level controlled Vital Signs Assessment: post-procedure vital signs reviewed and stable Respiratory status: spontaneous breathing Cardiovascular status: stable Anesthetic complications: no    Last Vitals:  Vitals:   05/05/17 1500 05/05/17 1515  BP: (!) 142/77 (!) 147/88  Pulse: 87 85  Resp: (!) 22 (!) 26  Temp: 36.7 C     Last Pain:  Vitals:   05/05/17 1519  TempSrc:   PainSc: 5                  Deaire Mcwhirter

## 2017-05-05 NOTE — Discharge Instructions (Signed)
° ° ° °GASTRIC BYPASS/SLEEVE ° Home Care Instructions ° ° These instructions are to help you care for yourself when you go home. ° °Call: If you have any problems. °• Call [phone] and ask for the surgeon on call °• If you need immediate assistance come to the ER at . Tell the ER staff you are a new post-op gastric bypass or gastric sleeve patient  °Signs and symptoms to report: • Severe  vomiting or nausea °o If you cannot handle clear liquids for longer than 1 day, call your surgeon °• Abdominal pain which does not get better after taking your pain medication °• Fever greater than 100.4°  F and chills °• Heart rate over 100 beats a minute °• Trouble breathing °• Chest pain °• Redness,  swelling, drainage, or foul odor at incision (surgical) sites °• If your incisions open or pull apart °• Swelling or pain in calf (lower leg) °• Diarrhea (Loose bowel movements that happen often), frequent watery, uncontrolled bowel movements °• Constipation, (no bowel movements for 3 days) if this happens: °o Take Milk of Magnesia, 2 tablespoons by mouth, 3 times a day for 2 days if needed °o Stop taking Milk of Magnesia once you have had a bowel movement °o Call your doctor if constipation continues °Or °o Take Miralax  (instead of Milk of Magnesia) following the label instructions °o Stop taking Miralax once you have had a bowel movement °o Call your doctor if constipation continues °• Anything you think is “abnormal for you” °  °Normal side effects after surgery: • Unable to sleep at night or unable to concentrate °• Irritability °• Being tearful (crying) or depressed ° °These are common complaints, possibly related to your anesthesia, stress of surgery, and change in lifestyle, that usually go away a few weeks after surgery. If these feelings continue, call your medical doctor.  °Wound Care: You may have surgical glue, steri-strips, or staples over your incisions after surgery °• Surgical glue: Looks like clear  film over your incisions and will wear off a little at a time °• Steri-strips: Adhesive strips of tape over your incisions. You may notice a yellowish color on skin under the steri-strips. This is used to make the steri-strips stick better. Do not pull the steri-strips off - let them fall off °• Staples: Staples may be removed before you leave the hospital °o If you go home with staples, call [facility] for an appointment with your surgeon’s nurse to have staples removed 10 days after surgery, [phone] °• Showering: You may shower two (2) days after your surgery unless your surgeon tells you differently °o Wash gently around incisions with warm soapy water, rinse well, and gently pat dry °o If you have a drain (tube from your incision), you may need someone to hold this while you shower °o No tub baths until staples are removed and incisions are healed °  °Medications: • Medications should be liquid or crushed if larger than the size of a dime °• Extended release pills (medication that releases a little bit at a time through the  day) should not be crushed °• Depending on the size and number of medications you take, you may need to space (take a few throughout the day)/change the time you take your medications so that you do not over-fill your pouch (smaller stomach) °• Make sure you follow-up with you primary care physician to make medication changes needed during rapid weight loss and life -style changes °•   If you have diabetes, follow up with your doctor that orders your diabetes medication(s) within one week after surgery and check your blood sugar regularly ° °• Do not drive while taking narcotics (pain medications) ° °• Do not take acetaminophen (Tylenol) and Roxicet or Lortab Elixir at the same time since these pain medications contain acetaminophen °  °Diet:  °First 2 Weeks You will see the nutritionist about two (2) weeks after your surgery. The nutritionist will increase the types of  foods you can eat if you are handling liquids well: °• If you have severe vomiting or nausea and cannot handle clear liquids lasting longer than 1 day call your surgeon °Protein Shake °• Drink at least 2 ounces of shake 5-6 times per day °• Each serving of protein shakes (usually 8-12 ounces) should have a minimum of: °o 15 grams of protein °o And no more than 5 grams of carbohydrate °• Goal for protein each day: °o Men = 80 grams per day °o Women = 60 grams per day °  ° • Protein powder may be added to fluids such as non-fat milk or Lactaid milk or Soy milk (limit to 35 grams added protein powder per serving) ° °Hydration °• Slowly increase the amount of water and other clear liquids as tolerated (See Acceptable Fluids) °• Slowly increase the amount of protein shake as tolerated °• Sip fluids slowly and throughout the day °• May use sugar substitutes in small amounts (no more than 6-8 packets per day; i.e. Splenda) ° °Fluid Goal °• The first goal is to drink at least 8 ounces of protein shake/drink per day (or as directed by the nutritionist); some examples of protein shakes are Syntrax Nectar, Adkins Advantage, EAS Edge HP, and Unjury. - See handout from pre-op Bariatric Education Class: °o Slowly increase the amount of protein shake you drink as tolerated °o You may find it easier to slowly sip shakes throughout the day °o It is important to get your proteins in first °• Your fluid goal is to drink 64-100 ounces of fluid daily °o It may take a few weeks to build up to this  °• 32 oz. (or more) should be clear liquids °And °• 32 oz. (or more) should be full liquids (see below for examples) °• Liquids should not contain sugar, caffeine, or carbonation ° °Clear Liquids: °• Water of Sugar-free flavored water (i.e. Fruit H²O, Propel) °• Decaffeinated coffee or tea (sugar-free) °• Crystal lite, Wyler’s Lite, Minute Maid Lite °• Sugar-free Jell-O °• Bouillon or broth °• Sugar-free Popsicle:    - Less than 20 calories  each; Limit 1 per day ° °Full Liquids: °                  Protein Shakes/Drinks + 2 choices per day of other full liquids °• Full liquids must be: °o No More Than 12 grams of Carbs per serving °o No More Than 3 grams of Fat per serving °• Strained low-fat cream soup °• Non-Fat milk °• Fat-free Lactaid Milk °• Sugar-free yogurt (Dannon Lite & Fit, Greek yogurt) ° °  °Vitamins and Minerals • Start 1 day after surgery unless otherwise directed by your surgeon °• 2 Chewable Bariatric Multivitamin / Multimineral Supplement with iron °• Chewable Calcium Citrate with Vitamin D-3 °(Example: 3 Chewable Calcium  Plus 600 with Vitamin D-3) °o Take 500 mg three (3) times a day for a total of 1500 mg each day °o Do not take all 3 doses of calcium   at one time as it may cause constipation, and you can only absorb 500 mg at a time °o Do not mix multivitamins containing iron with calcium supplements;  take 2 hours apart °• Menstruating women and those at risk for anemia ( a blood disease that causes weakness) may need extra iron °o Talk to your doctor to see if you need more iron °• If you need extra iron: Total daily Iron recommendation (including Vitamins) is 50 to 100 mg Iron/day °• Do not stop taking or change any vitamins or minerals until you talk to your nutritionist or surgeon °• Your nutritionist and/or surgeon must approve all vitamin and mineral supplements °  °Activity and Exercise: It is important to continue walking at home. Limit your physical activity as instructed by your doctor. During this time, use these guidelines: °• Do not lift anything greater than ten  (10) pounds for at least two (2) weeks °• Do not go back to work or drive until your surgeon says you can °• You may have sex when you feel comfortable °o It is VERY important for male patients to use a reliable birth control method; fertility often increase after surgery °o Do not get pregnant for at least 18 months °• Start exercising as soon as your  doctor tells you that you can °o Make sure your doctor approves any physical activity °• Start with a simple walking program °• Walk 5-15 minutes each day, 7 days per week °• Slowly increase until you are walking 30-45 minutes per day °• Consider joining our BELT program. [phone] or email [email or web address] °  °Special Instructions Things to remember: °• Use your CPAP when sleeping if this applies to you °• Consider buying a medical alert bracelet that says you had lap-band surgery °  °  You will likely have your first fill (fluid added to your band) 6 - 8 weeks after surgery °• [facility] has a free Bariatric Surgery Support Group that meets monthly, the 3rd Thursday, 6pm. [facility] [facility] Class[facility]. You can see classes online at [email or web address] °• It is very important to keep all follow up appointments with your surgeon, nutritionist, primary care physician, and behavioral health practitioner °o After the first year, please follow up with your bariatric surgeon and nutritionist at least once a year in order to maintain best weight loss results °      °             [facility]:  [phone] ° °             [facility]: [phone] ° °             Bariatric Nurse Coordinator: [phone]  °Gastric Bypass/Sleeve Home Care Instructions  Rev. 11/2012    ° °                                                    Reviewed and Endorsed °                                                   by [facility] Patient Education Committee, Jan, 2014 ° ° ° ° ° ° ° ° ° °

## 2017-05-05 NOTE — Op Note (Signed)
Preoperative diagnosis: laparoscopic sleeve gastrectomy  Postoperative diagnosis: Same   Procedure: Upper endoscopy   Surgeon: Baylin Cabal A Nadie Fiumara, M.D.  Anesthesia: Gen.   Indications for procedure: This patient was undergoing a laparoscopic sleeve gastrectomy.   Description of procedure: The endoscopy was placed in the mouth and into the oropharynx and under endoscopic vision it was advanced to the esophagogastric junction. The pouch was insufflated and no bleeding or bubbles were seen. The GEJ was identified at 42cm from the teeth.  No bleeding or leaks were detected. The scope was withdrawn without difficulty.   Timothy [name], M.D. General, Bariatric, & Minimally Invasive Surgery [facility]    

## 2017-05-05 NOTE — Anesthesia Preprocedure Evaluation (Addendum)
Anesthesia Evaluation  Patient identified by MRN, date of birth, ID band Patient awake    Reviewed: Allergy & Precautions, NPO status , Patient's Chart, lab work & pertinent test results  Airway Mallampati: II  TM Distance: >3 FB     Dental   Pulmonary sleep apnea ,    breath sounds clear to auscultation       Cardiovascular negative cardio ROS   Rhythm:Regular Rate:Normal     Neuro/Psych    GI/Hepatic negative GI ROS, Neg liver ROS,   Endo/Other  negative endocrine ROS  Renal/GU negative Renal ROS     Musculoskeletal   Abdominal   Peds  Hematology   Anesthesia Other Findings   Reproductive/Obstetrics                             Anesthesia Physical Anesthesia Plan  ASA: III  Anesthesia Plan: General   Post-op Pain Management:    Induction: Intravenous  PONV Risk Score and Plan: 2 and Ondansetron, Dexamethasone and Propofol infusion  Airway Management Planned: Oral ETT  Additional Equipment:   Intra-op Plan:   Post-operative Plan: Possible Post-op intubation/ventilation  Informed Consent: I have reviewed the patients History and Physical, chart, labs and discussed the procedure including the risks, benefits and alternatives for the proposed anesthesia with the patient or authorized representative who has indicated his/her understanding and acceptance.   Dental advisory given  Plan Discussed with: CRNA and Anesthesiologist  Anesthesia Plan Comments:         Anesthesia Quick Evaluation

## 2017-05-05 NOTE — Op Note (Signed)
Preop Diagnosis: Obesity Class III  Postop Diagnosis: same  Procedure performed: laparoscopic Sleeve Gastrectomy  Assitant: Phylliss Blakeshelsea Connor  Indications:  The patient is a 36 y.o. year-old morbidly obese male who has been followed in the Bariatric Clinic as an outpatient. This patient was diagnosed with morbid obesity with a BMI of Body mass index is 52.58 kg/m. and significant co-morbidities including hypertension and sleep apnea.  The patient was counseled extensively in the Bariatric Outpatient Clinic and after a thorough explanation of the risks and benefits of surgery (including death from complications, bowel leak, infection such as peritonitis and/or sepsis, internal hernia, bleeding, need for blood transfusion, bowel obstruction, organ failure, pulmonary embolus, deep venous thrombosis, wound infection, incisional hernia, skin breakdown, and others entailed on the consent form) and after a compliant diet and exercise program, the patient was scheduled for an elective laparoscopic sleeve gastrectomy.  Description of Operation:  Following informed consent, the patient was taken to the operating room and placed on the operating table in the supine position.  He had previously received prophylactic antibiotics and subcutaneous heparin for DVT prophylaxis in the pre-op holding area.  After induction of general endotracheal anesthesia by the anesthesiologist, the patient underwent placement of sequential compression devices, Foley catheter and an oro-gastric tube.  A timeout was confirmed by the surgery and anesthesia teams.  The patient was adequately padded at all pressure points and placed on a footboard to prevent slippage from the OR table during extremes of position during surgery.  He underwent a routine sterile prep and drape of her entire abdomen.    Next, A transverse incision was made under the left subcostal area and a 5mm optical viewing trocar was introduced into the peritoneal cavity.  Pneumoperitoneum was applied with a high flow and low pressure. A laparoscope was inserted to confirm placement. A extraperitoneal block was then placed at the lateral abdominal wall using exparel diluted with marcaine. 5 additional trocars were placed: 1 5mm trocar to the left of the midline. 1 additional 5mm trocar in the left lateral area, 1 12mm trocar in the right mid abdomen, and 1 5mm trocar in the right subcostal area.  The fat pad at the GE junction was incised and the gastrodiaphragmatic ligament was divided using the Harmonic scalpel. Next, a hole was created through the lesser omentum along the greater curve of the stomach to enter the lesser sac. The vessels along the greater omentum were  Then ligated and divided using the Harmonic scalpel moving towards the spleen and then short gastric vessels were ligated and divided in the same fashion to fully mobilize the fundus. The left crus was identified to ensure completion of the dissection. Next the antrum was measured and dissection continued inferiorly along the greater curve towards the pylorus and stopped 6cm from the pylorus.   A 40Fr ViSiGi dilator was placed into the esophgaus and along the lesser curve of the stomach and placed on suction. 2 non-reinforced 60mm 4-215mm tristapler(s) followed by 3 60mm 3-374mm tristaplers were used to make the resection along the antrum being sure to stay well away from the angularis by angling the jaws of the stapler towards the greater curve and later completing the resection staying along the ViSiGi and ensuring the fundus was not retained by appropriately retracting it lateral. Air was inserted through the ViSiGi to perform a leak test showing no bubbles and a neutral lie of the stomach.  The assistant then went and performed an upper endoscopy and leak test.  No bubbles were seen and the sleeve and antrum distended appropriately. The specimen was then placed in an endocatch bag and removed by the 15mm port.  The fascia of the 15mm port was closed with a 0 vicryl by suture passer. Hemostasis was ensured. Pneumoperitoneum was evacuated, all ports were removed and all incisions closed with 4-0 monocryl suture in subcuticular fashion. Steristrips and bandaids were put in place for dressing. The patient awoke from anesthesia and was brought to pacu in stable condition. All counts were correct.  Estimated blood loss: <2930ml  Specimens:  Sleeve gastrectomy  Local Anesthesia: 50 ml Exparel:0.5% Marcaine mix  Post-Op Plan:       Pain Management: PO, prn      Antibiotics: Prophylactic      Anticoagulation: Prophylactic, Starting now      Post Op Studies/Consults: Not applicable      Intended Discharge: within 48h      Intended Outpatient Follow-Up: Two Week      Intended Outpatient Studies: Not Applicable      Other: Not Applicable   De BlanchLuke Aaron Kyren Knick

## 2017-05-06 ENCOUNTER — Encounter (HOSPITAL_COMMUNITY): Payer: Self-pay | Admitting: General Surgery

## 2017-05-06 LAB — CBC WITH DIFFERENTIAL/PLATELET
BASOS PCT: 0 %
Basophils Absolute: 0 10*3/uL (ref 0.0–0.1)
EOS ABS: 0 10*3/uL (ref 0.0–0.7)
Eosinophils Relative: 0 %
HCT: 39.7 % (ref 39.0–52.0)
Hemoglobin: 14.1 g/dL (ref 13.0–17.0)
LYMPHS ABS: 1.1 10*3/uL (ref 0.7–4.0)
Lymphocytes Relative: 12 %
MCH: 30.7 pg (ref 26.0–34.0)
MCHC: 35.5 g/dL (ref 30.0–36.0)
MCV: 86.3 fL (ref 78.0–100.0)
MONO ABS: 0.3 10*3/uL (ref 0.1–1.0)
MONOS PCT: 3 %
NEUTROS PCT: 85 %
Neutro Abs: 8 10*3/uL — ABNORMAL HIGH (ref 1.7–7.7)
PLATELETS: 224 10*3/uL (ref 150–400)
RBC: 4.6 MIL/uL (ref 4.22–5.81)
RDW: 13.1 % (ref 11.5–15.5)
WBC: 9.4 10*3/uL (ref 4.0–10.5)

## 2017-05-06 LAB — COMPREHENSIVE METABOLIC PANEL
ALBUMIN: 4.1 g/dL (ref 3.5–5.0)
ALT: 39 U/L (ref 17–63)
AST: 40 U/L (ref 15–41)
Alkaline Phosphatase: 39 U/L (ref 38–126)
Anion gap: 8 (ref 5–15)
BILIRUBIN TOTAL: 0.7 mg/dL (ref 0.3–1.2)
BUN: 11 mg/dL (ref 6–20)
CHLORIDE: 105 mmol/L (ref 101–111)
CO2: 27 mmol/L (ref 22–32)
CREATININE: 0.85 mg/dL (ref 0.61–1.24)
Calcium: 9.1 mg/dL (ref 8.9–10.3)
GFR calc Af Amer: >60 mL/min (ref >60)
GFR calc non Af Amer: >60 mL/min (ref >60)
Glucose, Bld: 130 mg/dL — ABNORMAL HIGH (ref 65–99)
Potassium: 3.9 mmol/L (ref 3.5–5.1)
SODIUM: 140 mmol/L (ref 135–145)
TOTAL PROTEIN: 7.2 g/dL (ref 6.5–8.1)

## 2017-05-06 MED ORDER — LIP MEDEX EX OINT
TOPICAL_OINTMENT | CUTANEOUS | Status: AC
  Administered 2017-05-06: 10:00:00
  Filled 2017-05-06: qty 7

## 2017-05-06 NOTE — Progress Notes (Signed)
Pt was discharged home today. Instructions were reviewed with patient, and questions were answered. Pt was taken to main entrance via wheelchair by NT.  

## 2017-05-06 NOTE — Discharge Summary (Signed)
Physician Discharge Summary  Timothy [name] XBJ:[id number] DOB: 03/06/1981 DOA: 05/05/2017  PCP: Levonne Lappingann, Samandra, NP  Admit date: 05/05/2017 Discharge date: 05/06/2017  Recommendations for Outpatient Follow-up:  1.  (include homehealth, outpatient follow-up instructions, specific recommendations for PCP to follow-up on, etc.)  Follow-up Information    Brina Umeda, De BlanchLuke Aaron, MD. Go on 05/21/2017.   Specialty:  General Surgery Why:  at [address] Contact information: [address] [place] [address] [phone]        Makayla Confer, De BlanchLuke Aaron, MD Follow up.   Specialty:  General Surgery Contact information: [address] [place] [address] [phone]          Discharge Diagnoses:  Active Problems:   Morbid obesity (HCC)   Surgical Procedure: Laparoscopic Sleeve Gastrectomy, upper endoscopy  Discharge Condition: Good Disposition: Home  Diet recommendation: Postoperative sleeve gastrectomy diet (liquids only)  Filed Weights   05/05/17 1006  Weight: (!) 180.8 kg (398 lb 8 oz)     Hospital Course:  The patient was admitted after undergoing laparoscopic sleeve gastrectomy. POD 0 he ambulated well. POD 1 he was started on the water diet protocol and tolerated 500 ml in the first shift. Once meeting the water amount he was advanced to bariatric protein shakes which they tolerated and were discharged home POD 1.  Treatments: surgery: laparoscopic sleeve gastrectomy  Discharge Instructions  Discharge Instructions    Ambulate hourly while awake    Complete by:  As directed    Call MD for:  difficulty breathing, headache or visual disturbances    Complete by:  As directed    Call MD for:  persistant dizziness or light-headedness    Complete by:  As directed    Call MD for:  persistant nausea and vomiting    Complete by:  As directed    Call MD for:  redness, tenderness, or signs of infection (pain, swelling, redness, odor or green/yellow  discharge around incision site)    Complete by:  As directed    Call MD for:  severe uncontrolled pain    Complete by:  As directed    Call MD for:  temperature >101 F    Complete by:  As directed    Diet bariatric full liquid    Complete by:  As directed    Discharge wound care:    Complete by:  As directed    Remove Bandaids tomorrow, ok to shower tomorrow. Steristrips may fall off in 1-3 weeks.   Incentive spirometry    Complete by:  As directed    Perform hourly while awake     Allergies as of 05/06/2017      Reactions   Amoxicillin Rash   Has patient had a PCN reaction causing immediate rash, facial/tongue/throat swelling, SOB or lightheadedness with hypotension:No Has patient had a PCN reaction causing severe rash involving mucus membranes or skin necrosis:No Has patient had a PCN reaction that required hospitalization:No Has patient had a PCN reaction occurring within the last 10 years:Yes If all of the above answers are "NO", then may proceed with Cephalosporin use.      Medication List    TAKE these medications   calcium carbonate 500 MG chewable tablet Commonly known as:  TUMS - dosed in mg elemental calcium Chew 1 tablet by mouth daily.   clomiPHENE 50 MG tablet Commonly known as:  CLOMID TAKE 1 TABLET 3 TIMES WEEKLY *WAITING ON PA**   multivitamin tablet Take  1 tablet by mouth daily.   valACYclovir 500 MG tablet Commonly known as:  VALTREX Take 500 mg by mouth daily as needed (for outbreaks).      Follow-up Information    Jamontae Thwaites, De BlanchLuke Aaron, MD. Go on 05/21/2017.   Specialty:  General Surgery Why:  at [address] Contact information: [address] [place] [address] [phone]        Marlee Trentman, De BlanchLuke Aaron, MD Follow up.   Specialty:  General Surgery Contact information: [address] [place] [address] [phone]            The results of significant diagnostics from this hospitalization (including imaging,  microbiology, ancillary and laboratory) are listed below for reference.    Significant Diagnostic Studies: No results found.  Labs: Basic Metabolic Panel:  Recent Labs Lab 05/01/17 0941 05/05/17 1544 05/06/17 0527  NA 139  --  140  K 4.3  --  3.9  CL 106  --  105  CO2 25  --  27  GLUCOSE 128*  --  130*  BUN 15  --  11  CREATININE 0.96 1.02 0.85  CALCIUM 9.3  --  9.1   Liver Function Tests:  Recent Labs Lab 05/06/17 0527  AST 40  ALT 39  ALKPHOS 39  BILITOT 0.7  PROT 7.2  ALBUMIN 4.1    CBC:  Recent Labs Lab 05/01/17 0941 05/05/17 1544 05/06/17 0527  WBC 5.7 9.2 9.4  NEUTROABS  --   --  8.0*  HGB 14.5 13.7  13.7 14.1  HCT 40.8 38.1*  38.4* 39.7  MCV 85.4 86.2 86.3  PLT 217 198 224    CBG: No results for input(s): GLUCAP in the last 168 hours.  Active Problems:   Morbid obesity (HCC)   Time coordinating discharge: <6015min

## 2017-05-06 NOTE — Progress Notes (Signed)
Patient alert and oriented, pain is controlled. Patient is tolerating fluids, advanced to protein shake today, patient is tolerating well.  Reviewed Gastric sleeve discharge instructions with patient and patient is able to articulate understanding.  Provided information on BELT program, Support Group and [facility] outpatient pharmacy. All questions answered, will continue to monitor.  

## 2017-05-06 NOTE — Progress Notes (Signed)
Patient alert and oriented, Post op day 1.  Provided support and encouragement.  Encouraged pulmonary toilet, ambulation and small sips of liquids.  Patient completed 12 ounces of clear liquids and 4 ounces of Premiere protein. All questions answered.  Will continue to monitor.

## 2017-05-07 ENCOUNTER — Telehealth: Payer: Self-pay | Admitting: General Surgery

## 2017-05-07 NOTE — Telephone Encounter (Signed)
Pt called stating he was coughing up brown tinged fluid.  Has happened a couple times since getting home from surgery.  Denies SOB or fevers.  Recommended he use his incentive spirometer and call us back if the condition worsens or does not go away.

## 2017-05-15 ENCOUNTER — Telehealth (HOSPITAL_COMMUNITY): Payer: Self-pay

## 2017-05-15 NOTE — Telephone Encounter (Signed)
Made discharge phone call to patient per. Asking the following questions.    1. Do you have someone to care for you now that you are home?   2. Are you having pain now that is not relieved by your pain medication?   3. Are you able to drink the recommended daily amount of fluids (48 ounces minimum/day) and protein (60-80 grams/day) as prescribed by the dietitian or nutritional counselor?   4. Are you taking the vitamins and minerals as prescribed?   5. Do you have the "on call" number to contact your surgeon if you have a problem or question?   6. Are your incisions free of redness, swelling or drainage? (If steri strips, address that these can fall off, shower as tolerated)  7. Have your bowels moved since your surgery?  If not, are you passing gas?   8. Are you up and walking 3-4 times per day?   9. Were you provided your discharge medications before your surgery or before you were discharged from the hospital and are you taking them without problem?

## 2017-05-20 ENCOUNTER — Encounter: Payer: BC Managed Care – PPO | Attending: Internal Medicine | Admitting: Registered"

## 2017-05-20 DIAGNOSIS — Z713 Dietary counseling and surveillance: Secondary | ICD-10-CM | POA: Diagnosis not present

## 2017-05-20 DIAGNOSIS — E669 Obesity, unspecified: Secondary | ICD-10-CM

## 2017-05-20 NOTE — Progress Notes (Signed)
Bariatric Class:  Appt start time: 1530 end time:  1630.  2 Week Post-Operative Nutrition Class  Patient was seen on 05/20/2017 for Post-Operative Nutrition education at the [facility].   Pt reports experiencing some abdominal pain.   Surgery date: 05/05/2017 Surgery type: Sleeve gastrectomy Start weight at [facility]: 402.4 lbs Weight today: 372.8 lbs Weight change: 29.6 lbs loss  TANITA  BODY COMP RESULTS  05/20/2017   BMI (kg/m^2) 49.2   Fat Mass (lbs) 199.0   Fat Free Mass (lbs) 173.8   Total Body Water (lbs) N/A   The following the learning objectives were met by the patient during this course:  Identifies Phase 3A (Soft, High Proteins) Dietary Goals and will begin from 2 weeks post-operatively to 2 months post-operatively  Identifies appropriate sources of fluids and proteins   States protein recommendations and appropriate sources post-operatively  Identifies the need for appropriate texture modifications, mastication, and bite sizes when consuming solids  Identifies appropriate multivitamin and calcium sources post-operatively  Describes the need for physical activity post-operatively and will follow MD recommendations  States when to call healthcare provider regarding medication questions or post-operative complications  Handouts given during class include:  Phase 3A: Soft, High Protein Diet Handout  Follow-Up Plan: Patient will follow-up at [facility] in 6 weeks for 2 month post-op nutrition visit for diet advancement per MD.

## 2017-05-24 ENCOUNTER — Emergency Department (HOSPITAL_COMMUNITY): Payer: BC Managed Care – PPO

## 2017-05-24 ENCOUNTER — Emergency Department (HOSPITAL_COMMUNITY)
Admission: EM | Admit: 2017-05-24 | Discharge: 2017-05-24 | Disposition: A | Discharge status: Home / Self Care | Payer: BC Managed Care – PPO | Attending: Emergency Medicine | Admitting: Emergency Medicine

## 2017-05-24 ENCOUNTER — Encounter (HOSPITAL_COMMUNITY): Payer: Self-pay

## 2017-05-24 DIAGNOSIS — M79672 Pain in left foot: Secondary | ICD-10-CM | POA: Insufficient documentation

## 2017-05-24 LAB — D-DIMER, QUANTITATIVE (NOT AT ARMC): D DIMER QUANT: 0.49 ug{FEU}/mL (ref 0.00–0.50)

## 2017-05-24 LAB — CBC
HCT: 41.5 % (ref 39.0–52.0)
Hemoglobin: 14.6 g/dL (ref 13.0–17.0)
MCH: 30.5 pg (ref 26.0–34.0)
MCHC: 35.2 g/dL (ref 30.0–36.0)
MCV: 86.6 fL (ref 78.0–100.0)
PLATELETS: 218 10*3/uL (ref 150–400)
RBC: 4.79 MIL/uL (ref 4.22–5.81)
RDW: 13.1 % (ref 11.5–15.5)
WBC: 8.7 10*3/uL (ref 4.0–10.5)

## 2017-05-24 MED ORDER — HYDROCODONE-ACETAMINOPHEN 5-325 MG PO TABS: 1.0000 | ORAL_TABLET | ORAL | 0 refills | Status: AC | PRN

## 2017-05-24 MED ORDER — HYDROCODONE-ACETAMINOPHEN 5-325 MG PO TABS
1.0000 | ORAL_TABLET | Freq: Once | ORAL | Status: AC
  Administered 2017-05-24: 1 via ORAL
  Filled 2017-05-24: qty 1

## 2017-05-24 MED ORDER — PREDNISONE 10 MG PO TABS: 20.0000 mg | ORAL_TABLET | Freq: Two times a day (BID) | ORAL | 0 refills | Status: DC

## 2017-05-24 NOTE — ED Provider Notes (Signed)
[facility]-EMERGENCY DEPT Provider Note   CSN: [id number] Arrival date & time: 05/24/17  1525  By signing my name below, I, Timothy [name], attest that this documentation has been prepared under the direction and in the presence of Kerrie Buffalo, NP Electronically Signed: Deland [name], ED Scribe. 05/24/17. 5:13 PM.  History   Chief Complaint Chief Complaint  Patient presents with  . Foot Pain   The history is provided by the patient. No language interpreter was used.  Foot Pain  This is a new problem. The current episode started 6 to 12 hours ago. The problem has been gradually worsening. The symptoms are aggravated by walking and standing. Nothing relieves the symptoms. He has tried acetaminophen for the symptoms. The treatment provided mild relief.   HPI Comments: Timothy [name] is a 36 y.o. male who presents to the Emergency Department complaining of a sudden onset of gradually worsening left heel that began this morning. He denies recent fall or injury. Movement, standing and walking exacerbates his pain.  He works as a Museum/gallery curator and per spouse, the pt drives 3 hours for work every day. The pt has been taking tylenol and tramadol with inadequate relief. He reports of a FMHx of gout, and endorses a recent bariatric surgery. The pt denies fever, nausea, vomiting, joint swelling, and any open wounds.   Past Medical History:  Diagnosis Date  . Exertional chest pain   . Obesity   . Sleep apnea    gets cpap on 05/16/17    Patient Active Problem List   Diagnosis Date Noted  . Morbid obesity (HCC) 05/05/2017  . Chest pain 03/30/2016  . Abnormal EKG 03/30/2016  . Obesity 03/30/2016  . Hypogonadism, male 02/10/2015    Past Surgical History:  Procedure Laterality Date  . LAPAROSCOPIC GASTRIC SLEEVE RESECTION N/A 05/05/2017   Procedure: LAPAROSCOPIC GASTRIC SLEEVE RESECTION WITH UPPER ENDO;  Surgeon: Kinsinger, De Blanch, MD;  Location: [facility] ORS;  Service: General;   Laterality: N/A;       Home Medications    Prior to Admission medications   Medication Sig Start Date End Date Taking? Authorizing Provider  calcium carbonate (TUMS - DOSED IN MG ELEMENTAL CALCIUM) 500 MG chewable tablet Chew 1 tablet by mouth daily.    [provider]  clomiPHENE (CLOMID) 50 MG tablet TAKE 1 TABLET 3 TIMES WEEKLY *WAITING ON PA** Patient not taking: Reported on 04/30/2017 02/13/17   Reather Littler, MD  HYDROcodone-acetaminophen (NORCO/VICODIN) 5-325 MG tablet Take 1 tablet by mouth every 4 (four) hours as needed. 05/24/17   Janne Napoleon, NP  Multiple Vitamin (MULTIVITAMIN) tablet Take 1 tablet by mouth daily.    [provider]  predniSONE (DELTASONE) 10 MG tablet Take 2 tablets (20 mg total) by mouth 2 (two) times daily with a meal. 05/24/17   Damian Leavell, [place], NP  valACYclovir (VALTREX) 500 MG tablet Take 500 mg by mouth daily as needed (for outbreaks).  01/01/15   [provider]    Family History Family History  Problem Relation Age of Onset  . Diabetes Mother   . Hypertension Mother   . Diabetes Sister     Social History Social History  Substance Use Topics  . Smoking status: Never Smoker  . Smokeless tobacco: Never Used  . Alcohol use No     Comment: occasional not in long time     Allergies   Amoxicillin   Review of Systems Review of Systems  Constitutional: Negative for chills and fever.  Gastrointestinal: Negative for nausea and vomiting.  Musculoskeletal: Positive for myalgias. Negative for joint swelling.  Skin: Negative for wound.     Physical Exam Updated Vital Signs BP 123/74 (BP Location: Right Arm)   Pulse 92   Temp 98.2 F (36.8 C) (Oral)   Resp 16   Ht 6\' 1"  (1.854 m)   Wt (!) 167.8 kg (370 lb)   SpO2 99%   BMI 48.82 kg/m   Physical Exam  Constitutional: He is oriented to person, place, and time. He appears well-developed and well-nourished.  HENT:  Head: Normocephalic.  Eyes: EOM are normal.    Neck: Normal range of motion.  Cardiovascular:  Adequate circulation. Pedal pulses are 2+  Pulmonary/Chest: Effort normal.  Abdominal: He exhibits no distension.  Musculoskeletal: Normal range of motion. He exhibits tenderness.  Tenderness to the lateral malleolus and achilles.   Neurological: He is alert and oriented to person, place, and time.  Psychiatric: He has a normal mood and affect.  Nursing note and vitals reviewed.    ED Treatments / Results   DIAGNOSTIC STUDIES: Oxygen Saturation is 99% on RA, normal by my interpretation.   COORDINATION OF CARE: 5:10 PM-Discussed next steps with pt. Pt verbalized understanding and is agreeable with the plan.   Labs (all labs ordered are listed, but only abnormal results are displayed) Labs Reviewed  CBC  D-DIMER, QUANTITATIVE (NOT AT [facility])    Radiology Dg Ankle Complete Left  Result Date: 05/24/2017 CLINICAL DATA:  Pain and swelling without trauma. EXAM: LEFT ANKLE COMPLETE - 3+ VIEW COMPARISON:  None. FINDINGS: Mild soft tissue swelling. No fracture or bony erosion. No acute abnormalities are otherwise seen. IMPRESSION: Mild soft tissue swelling. Electronically Signed   By: Gerome Sam III M.D   On: 05/24/2017 17:57    Procedures Procedures (including critical care time)  Medications Ordered in ED Medications  HYDROcodone-acetaminophen (NORCO/VICODIN) 5-325 MG per tablet 1 tablet (1 tablet Oral Given 05/24/17 1918)     Initial Impression / Assessment and Plan / ED Course  I have reviewed the triage vital signs and the nursing notes.  Pertinent labs & imaging results that were available during my care of the patient were reviewed by me and considered in my medical decision making (see chart for details).  Dr. Judd Lien in to evaluate the patient. Will treat for possible gout.   Patient offered out patient DVT study, however, patient states that since the CBC and D-dimer are normal that he will wait and see his PCP. If  symptoms worsen he will return. Rx prednisone and Hydrocodone. Patient stable for d/c without septic joint, fever or other problems.   Final Clinical Impressions(s) / ED Diagnoses   Final diagnoses:  Foot pain, left    New Prescriptions New Prescriptions   HYDROCODONE-ACETAMINOPHEN (NORCO/VICODIN) 5-325 MG TABLET    Take 1 tablet by mouth every 4 (four) hours as needed.   PREDNISONE (DELTASONE) 10 MG TABLET    Take 2 tablets (20 mg total) by mouth 2 (two) times daily with a meal.  I personally performed the services described in this documentation, which was scribed in my presence. The recorded information has been reviewed and is accurate.    Kerrie Buffalo [place], [place] 05/24/17 2009    Geoffery Lyons, MD 05/24/17 [phone]

## 2017-05-24 NOTE — Discharge Instructions (Signed)
Follow up with your primary care provider for re evaluation of the foot. She may want to do further testing such as uric acid and further evaluate for possible gout. Return here as needed for worsening symptoms.

## 2017-05-24 NOTE — ED Triage Notes (Signed)
Patient states he woke this AM with left foot pain and slight swelling. Patient states he had Bariatric Surgery 3 weeks ago.

## 2017-06-11 ENCOUNTER — Ambulatory Visit: Payer: Self-pay | Admitting: Neurology

## 2017-06-11 ENCOUNTER — Telehealth: Payer: Self-pay

## 2017-06-11 NOTE — Telephone Encounter (Signed)
Pt did not show for his appt today with Dr. Frances FurbishAthar.

## 2017-06-17 ENCOUNTER — Encounter: Payer: Self-pay | Admitting: Neurology

## 2017-06-23 NOTE — Telephone Encounter (Signed)
Received this notice from [facility]: "This patient was scheduled for 05/16/2017 and rescheduled for 05/23/2017 and then no showed.  We have been unable to reach him since to reschedule.  I wanted to let you and Dr. Frances Furbish know that he has not started Pap therapy with [place]. "

## 2017-06-30 ENCOUNTER — Encounter: Payer: Self-pay | Admitting: Skilled Nursing Facility1

## 2017-06-30 ENCOUNTER — Encounter: Payer: BC Managed Care – PPO | Attending: Internal Medicine | Admitting: Skilled Nursing Facility1

## 2017-06-30 DIAGNOSIS — Z713 Dietary counseling and surveillance: Secondary | ICD-10-CM | POA: Insufficient documentation

## 2017-06-30 NOTE — Patient Instructions (Addendum)
-  Do half a protein shake instead of a whole shake before dinner  -Always eat your protein first then start in on your vegetables  -Be sure to continue to chew well and go slow  -Work on getting in at least another 16.9 fluid ounces more in  -You can try the capsule multi vitamin now  -Soft cook your vegetables for 2 whole days and if that goes well then try raw vegetables

## 2017-06-30 NOTE — Progress Notes (Signed)
Follow-up visit:  8 Weeks Post-Operative Sleeve Surgery  Primary concerns today: Post-operative Bariatric Surgery Nutrition Management.  Pt states he ended up in the hospital with a gouty flarup in his left ankle. Pt states his abdominal pain has subsided. Pt states it has been awekward with bubbys not drinking and not eating like they are. Pt staets he has not had any hunger pains or appetite feelings. Pt states he is setting alarms to remmber to eat. Pt states he has done 27 push ups which he is very proud of.   Surgery date: 05/05/2017 Surgery type: Sleeve gastrectomy Start weight at [facility]: 402.4 lbs Weight today: 355.1 lbs Weight change: 17.8 lbs loss  TANITA  BODY COMP RESULTS  05/20/2017   BMI (kg/m^2) 49.2   Fat Mass (lbs) 199.0   Fat Free Mass (lbs) 173.8   Total Body Water (lbs) N/A    24-hr recall: commuting to outside of [place]  B (4:45AM): premier shake  Snk (AM): cheese L (PM): chicken or [place] wrapped in cheese Snk (PM): protein shake D (PM): meat Snk (PM): sugar free popcicle   Fluid intake: 32 ounces of water in the morning + 16.9 ounces water + 16.9 + powerade zero or gatorade zero (32 ounces)  Estimated total protein intake: 80+  Medications: See List Supplementation: celebrate multi and calcium 3 times a day  Using straws: no Drinking while eating: no Having you been chewing well:no Chewing/swallowing difficulties: no Changes in vision: no Changes to mood/headaches: no Hair loss/Cahnges to skin/Changes to nails: no Any difficulty focusing or concentrating: no Sweating: no Dizziness/Lightheaded: no Palpitations: no  Carbonated beverages: no N/V/D/C/GAS: no Abdominal Pain: no Dumping syndrome: no  Recent physical activity:  Cardio and wts 4 times: 60-90 minutes (2 days a week for resistance)   Progress Towards Goal(s):  In progress.  Handouts given during visit include:  Non-starchy vegetables + protein   Nutritional Diagnosis:  [place]-3.3  Overweight/obesity related to past poor dietary habits and physical inactivity as evidenced by patient w/ recent sleeve surgery following dietary guidelines for continued weight loss.    Intervention:  Nutrition cousneling. Dietitian educated the pt on advancing his diet to include non-starchy vegetables. Goals: -Do half a protein shake instead of a whole shake before dinner -Always eat your protein first then start in on your vegetables -Be sure to continue to chew well and go slow -Work on getting in at least another 16.9 fluid ounces more in -You can try the capsule multi vitamin now -Soft cook your vegetables for 2 whole days and if that goes well then try raw vegetables   Teaching Method Utilized:  Visual Auditory Hands on  Barriers to learning/adherence to lifestyle change: none identified  Demonstrated degree of understanding via:  Teach Back   Monitoring/Evaluation:  Dietary intake, exercise, and body weight.

## 2017-08-25 ENCOUNTER — Ambulatory Visit: Payer: BC Managed Care – PPO | Admitting: Skilled Nursing Facility1

## 2017-11-20 IMAGING — DX DG CHEST 2V
2 series · 2 of 2 positions shown · non-contrast
Comparison: Chest x-ray 03/30/2016 .

CLINICAL DATA: Morbid obesity .

EXAM:
CHEST  2 VIEW

[chest pa]
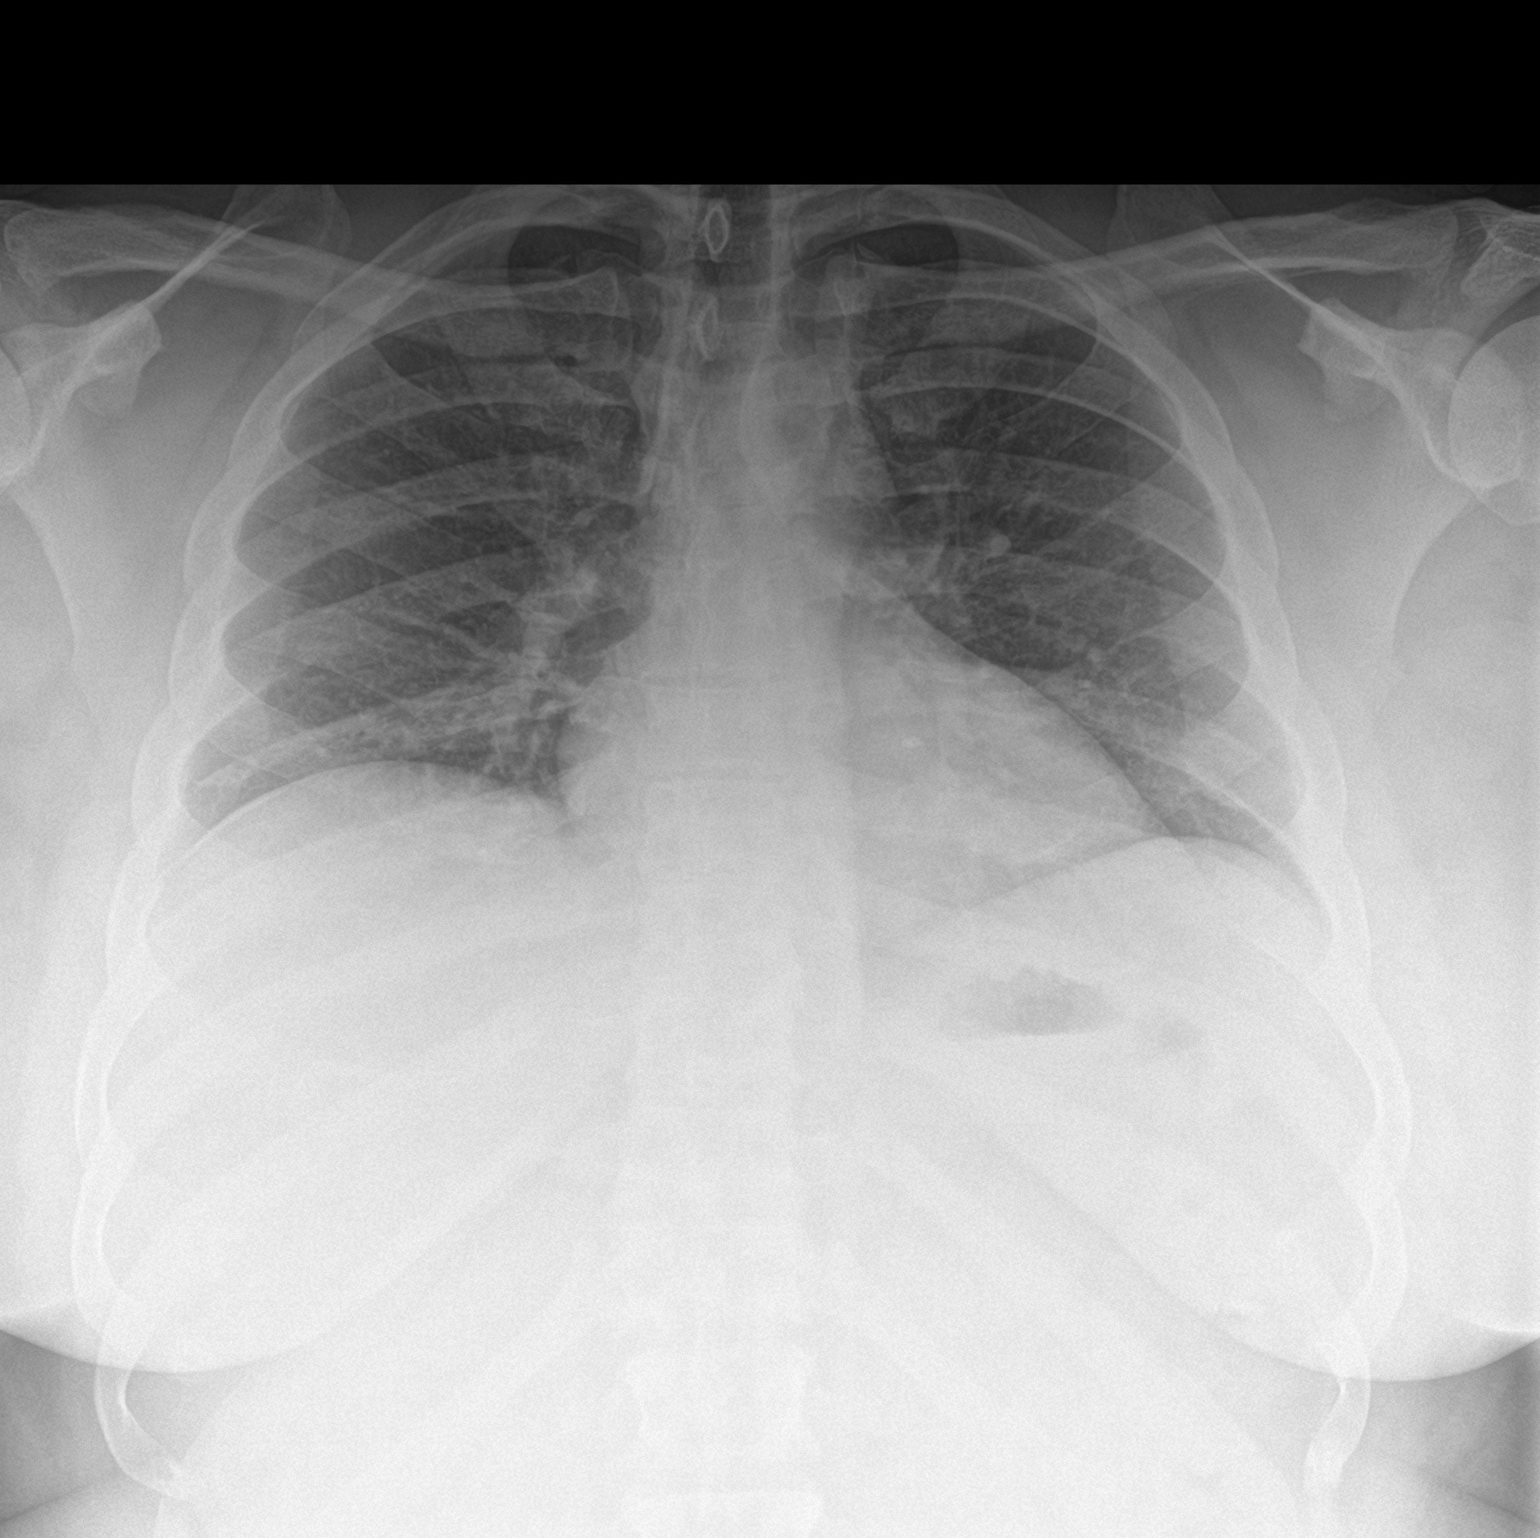

[chest lat]
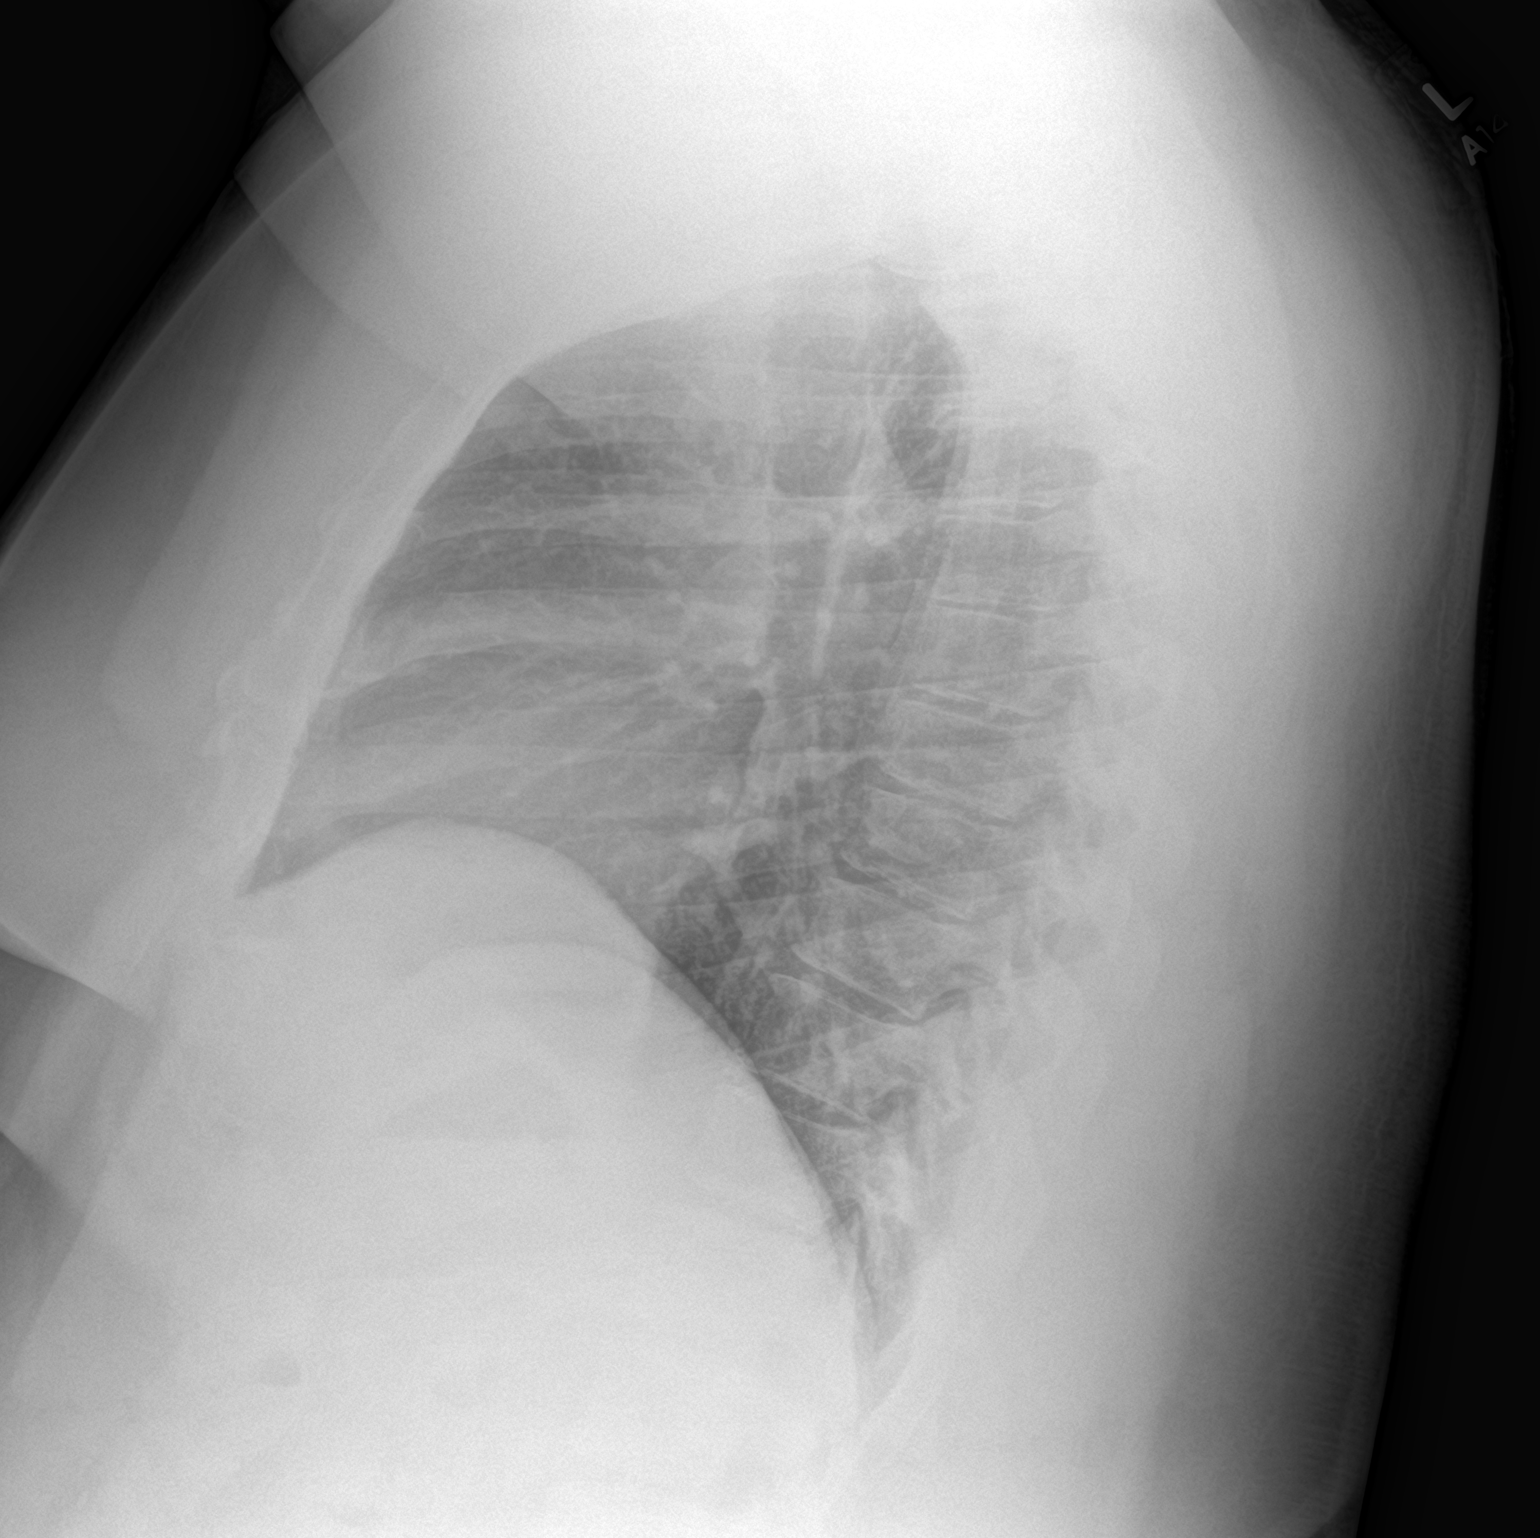

[2 of 2 positions shown; findings below may reference images not displayed]

FINDINGS: Mediastinum and hilar structures normal. Lungs are clear. Heart size
normal. No pleural effusion or pneumothorax.
IMPRESSION: No acute cardiopulmonary disease .

## 2018-05-01 ENCOUNTER — Ambulatory Visit (INDEPENDENT_AMBULATORY_CARE_PROVIDER_SITE_OTHER): Payer: BC Managed Care – PPO

## 2018-05-01 ENCOUNTER — Ambulatory Visit: Payer: BC Managed Care – PPO | Admitting: Podiatry

## 2018-05-01 ENCOUNTER — Encounter: Payer: Self-pay | Admitting: Podiatry

## 2018-05-01 VITALS — BP 119/66 | HR 80 | Resp 16

## 2018-05-01 DIAGNOSIS — M779 Enthesopathy, unspecified: Secondary | ICD-10-CM

## 2018-05-01 DIAGNOSIS — M2042 Other hammer toe(s) (acquired), left foot: Secondary | ICD-10-CM | POA: Diagnosis not present

## 2018-05-01 DIAGNOSIS — M7752 Other enthesopathy of left foot: Secondary | ICD-10-CM

## 2018-05-01 MED ORDER — TRIAMCINOLONE ACETONIDE 10 MG/ML IJ SUSP
10.0000 mg | Freq: Once | INTRAMUSCULAR | Status: AC
  Administered 2018-05-01: 10 mg

## 2018-05-01 NOTE — Progress Notes (Signed)
   Subjective:    Patient ID: Timothy [name], male    DOB: 1981/02/10, 37 y.o.   MRN: [id number]  HPI    Review of Systems  All other systems reviewed and are negative.      Objective:   Physical Exam        Assessment & Plan:

## 2018-05-02 NOTE — Progress Notes (Signed)
Subjective:   Patient ID: Timothy PaganiniJonathan A [name], male   DOB: 37 y.o.   MRN: [id number]   HPI Patient presents stating his left fifth toe is been so sore it is been over a year.  Patient states he is tried wider shoes padding without relief his symptoms and it seems like it is making it more difficult to wear closed in shoes.  Patient does not smoke likes to be active   Review of Systems  All other systems reviewed and are negative.       Objective:  Physical Exam  Constitutional: He appears well-developed and well-nourished.  Cardiovascular: Intact distal pulses.  Pulmonary/Chest: Effort normal.  Musculoskeletal: Normal range of motion.  Neurological: He is alert.  Skin: Skin is warm.  Nursing note and vitals reviewed.   Neurovascular status found to be intact muscle strength is adequate range of motion within normal limits with patient found to have an inflamed fifth digit left with keratotic lesion formation fluid buildup in the inner phalangeal joint that is painful and rotated fifth digit with pain.  Patient's found to have good digital perfusion and well oriented x3     Assessment:  Digital hammertoe deformity fifth left with keratotic lesion and fluid buildup the inner phalangeal joint     Plan:  H&P condition reviewed x-ray taken.  Today I went ahead did a proximal nerve block injected the interphalangeal joint 2 mg dexamethasone Kenalog and debrided the lesion and applied padding.  I discussed very possible arthroplasty for this and reviewed the procedure and all risk and reviewed with him the recovery.  And he is good to consider this based on response to this treatment I did today  X-ray indicates that the toe is in good alignment and does have some hypertrophy of the head of the proximal phalanx with no other pathology

## 2019-08-30 ENCOUNTER — Other Ambulatory Visit: Payer: Self-pay

## 2019-08-30 DIAGNOSIS — Z20822 Contact with and (suspected) exposure to covid-19: Secondary | ICD-10-CM

## 2019-08-31 LAB — NOVEL CORONAVIRUS, NAA: SARS-CoV-2, NAA: NOT DETECTED

## 2019-11-15 ENCOUNTER — Encounter (HOSPITAL_COMMUNITY): Payer: Self-pay

## 2020-06-30 ENCOUNTER — Ambulatory Visit: Payer: BC Managed Care – PPO | Admitting: Podiatry

## 2020-07-06 ENCOUNTER — Ambulatory Visit (INDEPENDENT_AMBULATORY_CARE_PROVIDER_SITE_OTHER): Payer: BC Managed Care – PPO | Admitting: Podiatry

## 2020-07-06 ENCOUNTER — Encounter: Payer: Self-pay | Admitting: Podiatry

## 2020-07-06 ENCOUNTER — Other Ambulatory Visit: Payer: Self-pay

## 2020-07-06 DIAGNOSIS — M779 Enthesopathy, unspecified: Secondary | ICD-10-CM

## 2020-07-06 DIAGNOSIS — M2042 Other hammer toe(s) (acquired), left foot: Secondary | ICD-10-CM

## 2020-07-06 NOTE — Progress Notes (Signed)
Subjective:   Patient ID: Timothy [name], male   DOB: 39 y.o.   MRN: [id number]   HPI Patient states he has had reoccurrence of discomfort in the left fifth toe and stated for about 2 years it was real good   ROS      Objective:  Physical Exam  Neurovascular status intact with inflammation pain in her phalangeal joint digit 5 left with fluid buildup around the head of the proximal phalanx with pain with palpation     Assessment:  Inflammatory capsulitis fifth digit left with keratotic lesion     Plan:  H&P reviewed hammertoe in consideration for correction and at this point sterile anesthesia to the entire fifth digit left accomplished and I then did sterile prep and injected the inner phalangeal joint 2 mg dexamethasone Kenalog and then debrided the lesion with no iatrogenic bleeding applied padding and patient will be seen back as needed and understands ultimately may require surgery

## 2020-11-29 ENCOUNTER — Encounter (HOSPITAL_COMMUNITY): Payer: Self-pay | Admitting: *Deleted

## 2021-11-30 ENCOUNTER — Encounter (HOSPITAL_COMMUNITY): Payer: Self-pay | Admitting: *Deleted

## 2022-01-04 ENCOUNTER — Encounter: Payer: Self-pay | Admitting: Podiatry

## 2022-01-04 ENCOUNTER — Ambulatory Visit (INDEPENDENT_AMBULATORY_CARE_PROVIDER_SITE_OTHER): Payer: BC Managed Care – PPO | Admitting: Podiatry

## 2022-01-04 DIAGNOSIS — M7752 Other enthesopathy of left foot: Secondary | ICD-10-CM

## 2022-01-04 MED ORDER — TRIAMCINOLONE ACETONIDE 10 MG/ML IJ SUSP
10.0000 mg | Freq: Once | INTRAMUSCULAR | Status: AC
  Administered 2022-01-04: 10 mg

## 2022-01-04 NOTE — Progress Notes (Signed)
Subjective:  ? ?Patient ID: Timothy [name], male   DOB: 41 y.o.   MRN: [id number]  ? ?HPI ?Patient presents stating his toe has been inflamed and sore and he did well for a good period of time neuro ? ? ?ROS ? ? ?   ?Objective:  ?Physical Exam  ?Vascular status intact with inflammation of the fifth digit left foot with fluid buildup at the inner phalangeal joint rotational component ? ?   ?Assessment:  ?Hammertoe deformity digit 5 left with inflamed capsule ? ?   ?Plan:  ?There will prep injected the capsule left fifth digit inner phalangeal joint 2 mg dexamethasone Kenalog and 5 mg Xylocaine debrided lesion applied padding discussed hammertoe correction which may be done at 1 point in future if it does not respond to conservative continue treatment ?   ? ? ?

## 2022-11-29 ENCOUNTER — Encounter (HOSPITAL_COMMUNITY): Payer: Self-pay | Admitting: *Deleted

## 2023-05-16 ENCOUNTER — Encounter: Payer: Self-pay | Admitting: Podiatry

## 2023-05-16 ENCOUNTER — Ambulatory Visit: Payer: BC Managed Care – PPO | Admitting: Podiatry

## 2023-05-16 DIAGNOSIS — M7752 Other enthesopathy of left foot: Secondary | ICD-10-CM | POA: Diagnosis not present

## 2023-05-18 NOTE — Progress Notes (Signed)
Subjective:   Patient ID: Timothy [name], male   DOB: 42 y.o.   MRN: [id number]   HPI Patient presents with inflammation around the fifth digit left foot with irritation of the tissue and pain   ROS      Objective:  Physical Exam  Inflammatory capsulitis of the inner phalangeal joint digit 5 left painful when pressed     Assessment:  Reviewed condition and went ahead today and discussed     Plan:  H&P done went ahead did sterile prep injected the inner phalangeal joint 3 mg Dexasone Kenalog 5 mg Xylocaine debrided the lesion applied cushion reappoint and eventually may need surgery

## 2023-08-09 ENCOUNTER — Encounter (HOSPITAL_COMMUNITY): Payer: Self-pay

## 2023-08-09 ENCOUNTER — Emergency Department (HOSPITAL_COMMUNITY)
Admission: EM | Admit: 2023-08-09 | Discharge: 2023-08-09 | Disposition: A | Discharge status: Home / Self Care | Payer: BC Managed Care – PPO

## 2023-08-09 ENCOUNTER — Emergency Department (HOSPITAL_COMMUNITY): Payer: BC Managed Care – PPO

## 2023-08-09 ENCOUNTER — Other Ambulatory Visit: Payer: Self-pay

## 2023-08-09 DIAGNOSIS — M25571 Pain in right ankle and joints of right foot: Secondary | ICD-10-CM | POA: Diagnosis present

## 2023-08-09 DIAGNOSIS — I10 Essential (primary) hypertension: Secondary | ICD-10-CM | POA: Insufficient documentation

## 2023-08-09 HISTORY — DX: Essential (primary) hypertension: I10

## 2023-08-09 MED ORDER — IBUPROFEN 600 MG PO TABS: 600.0000 mg | ORAL_TABLET | Freq: Three times a day (TID) | ORAL | 0 refills | Status: AC | PRN

## 2023-08-09 MED ORDER — PREDNISONE 20 MG PO TABS: 40.0000 mg | ORAL_TABLET | Freq: Every day | ORAL | 0 refills | Status: AC

## 2023-08-09 MED ORDER — IBUPROFEN 800 MG PO TABS
800.0000 mg | ORAL_TABLET | Freq: Once | ORAL | Status: AC
  Administered 2023-08-09: 800 mg via ORAL
  Filled 2023-08-09: qty 1

## 2023-08-09 NOTE — ED Triage Notes (Signed)
Patient said his right foot has been hurting since Monday. Feels sore to touch and feels warm. He was worried he has a blood clot. Pain moves up his leg. No history of blood clots before.

## 2023-08-09 NOTE — Discharge Instructions (Addendum)
You have been seen today for your complaint of right ankle pain. Your imaging was reassuring. Your discharge medications include prednisone.  This is a steroid.  Take it as prescribed and for the entire duration of the prescription. Ibuprofen.  Take 1 tablet every 8 hours as needed for the next week for your pain. Follow up with: Your PCP in 1 week for reevaluation Please seek immediate medical care if you develop any of the following symptoms: Have severe or uncontrolled pain. Cannot urinate. At this time there does not appear to be the presence of an emergent medical condition, however there is always the potential for conditions to change. Please read and follow the below instructions.  Do not take your medicine if  develop an itchy rash, swelling in your mouth or lips, or difficulty breathing; call 911 and seek immediate emergency medical attention if this occurs.  You may review your lab tests and imaging results in their entirety on your MyChart account.  Please discuss all results of fully with your primary care provider and other specialist at your follow-up visit.  Note: Portions of this text may have been transcribed using voice recognition software. Every effort was made to ensure accuracy; however, inadvertent computerized transcription errors may still be present.

## 2023-08-09 NOTE — ED Provider Notes (Signed)
[place] EMERGENCY DEPARTMENT AT [facility] Provider Note   CSN: [id number] Arrival date & time: 08/09/23  1507     History  Chief Complaint  Patient presents with   Foot Pain    Timothy [name] is a 42 y.o. male.  With a history of hypertension, gout presenting to the ED for evaluation of right ankle pain.  He states this pain began approximately 5 days ago.  It is localized to the anterior portion of the right ankle.  Occasionally radiates up the tibia.  He has a family history of blood clots and is concerned for blood clot.  Pain is worse with palpation, not specifically worse with movement or ambulation.  He has not taken anything for pain prior to arrival.  He states it feels similar to when he has had gout in the right ankle in the past.  No recent injuries or trauma.  No history of surgery.  No fevers or chills, nausea or vomiting.   Foot Pain       Home Medications Prior to Admission medications   Medication Sig Start Date End Date Taking? Authorizing Provider  ibuprofen (ADVIL) 600 MG tablet Take 1 tablet (600 mg total) by mouth every 8 (eight) hours as needed for up to 7 days. 08/09/23 08/16/23 Yes Randie Tallarico, Edsel Petrin, PA-C  predniSONE (DELTASONE) 20 MG tablet Take 2 tablets (40 mg total) by mouth daily with breakfast for 4 days. 08/09/23 08/13/23 Yes Josph Norfleet, Edsel Petrin, PA-C  calcium carbonate (TUMS - DOSED IN MG ELEMENTAL CALCIUM) 500 MG chewable tablet Chew 1 tablet by mouth daily.    [provider]  cephALEXin (KEFLEX) 500 MG capsule Take 500 mg by mouth 2 (two) times daily. 12/31/21   [provider]  clomiPHENE (CLOMID) 50 MG tablet TAKE 1 TABLET 3 TIMES WEEKLY *WAITING ON PA** 02/13/17   Reather Littler, MD  clonazePAM (KLONOPIN) 0.5 MG tablet Take by mouth.    [provider]  HYDROcodone-acetaminophen (NORCO/VICODIN) 5-325 MG tablet Take 1 tablet by mouth every 4 (four) hours as needed. 05/24/17   Janne Napoleon, NP  Multiple  Vitamin (MULTIVITAMIN) tablet Take 1 tablet by mouth daily.    [provider]  valACYclovir (VALTREX) 500 MG tablet Take by mouth.    [provider]  WEGOVY 0.25 MG/0.5ML SOAJ SMARTSIG:0.25 Milligram(s) SUB-Q Once a Week 11/27/21   [provider]      Allergies    Amoxicillin    Review of Systems   Review of Systems  Musculoskeletal:  Positive for arthralgias.  All other systems reviewed and are negative.   Physical Exam Updated Vital Signs BP (!) 161/99 (BP Location: Right Arm)   Pulse 88   Temp 97.9 F (36.6 C) (Oral)   Resp 18   Ht 6\' 1"  (1.854 m)   Wt (!) 142.9 kg   SpO2 98%   BMI 41.56 kg/m  Physical Exam Vitals and nursing note reviewed.  Constitutional:      General: He is not in acute distress.    Appearance: Normal appearance. He is normal weight. He is not ill-appearing.  HENT:     Head: Normocephalic and atraumatic.  Pulmonary:     Effort: Pulmonary effort is normal. No respiratory distress.  Abdominal:     General: Abdomen is flat.  Musculoskeletal:        General: Normal range of motion.     Cervical back: Neck supple.     Comments: Mild TTP to  the anterior of the right ankle.  No swelling or overlying erythema.  No warmth.  No lesions or bruising.  DP pulses 2+ bilaterally.  Sensation intact in all digits.  Full AROM of the right ankle.  Compartments are soft.  Negative Homans' sign.  Skin:    General: Skin is warm and dry.  Neurological:     Mental Status: He is alert and oriented to person, place, and time.  Psychiatric:        Mood and Affect: Mood normal.        Behavior: Behavior normal.     ED Results / Procedures / Treatments   Labs (all labs ordered are listed, but only abnormal results are displayed) Labs Reviewed - No data to display  EKG None  Radiology DG Ankle Complete Right  Result Date: 08/09/2023 CLINICAL DATA:  Ankle pain for 5 days. No provided history of injury EXAM: RIGHT ANKLE - COMPLETE 3  VIEW COMPARISON:  X-ray 02/14/2004 FINDINGS: Small well-corticated Achilles greater than plantar calcaneal spurs. No fracture or dislocation. Preserved joint spaces and bone mineralization. IMPRESSION: Calcaneal spurs. Electronically Signed   By: Karen Kays M.D.   On: 08/09/2023 16:39    Procedures Procedures    Medications Ordered in ED Medications  ibuprofen (ADVIL) tablet 800 mg (800 mg Oral Given 08/09/23 1634)    ED Course/ Medical Decision Making/ A&P                                 Medical Decision Making Amount and/or Complexity of Data Reviewed Radiology: ordered.  Risk Prescription drug management.  This patient presents to the ED for concern of right ankle pain, this involves an extensive number of treatment options, and is a complaint that carries with it a high risk of complications and morbidity.  The differential diagnosis includes fracture, strain, sprain, gout, septic arthritis, DVT  My initial workup includes imaging, pain control  Additional history obtained from: Nursing notes from this visit.  I ordered imaging studies including x-ray right ankle I independently visualized and interpreted imaging which showed negative I agree with the radiologist interpretation  Afebrile, hemodynamically stable.  42 year old male presenting to the ED for evaluation of right anterior ankle pain.  This is atraumatic.  Similar to previous episodes of gout.  He appears very well on physical exam.  Ambulatory without difficulty.  Mild tenderness to palpation of the anterior of the ankle.  Neurovascular status is intact.  No overlying warmth or erythema.  X-ray imaging negative for fracture.  No calf pain.  Negative Homans' sign.  Very low suspicion for DVT.  Will treat for gout flareup with steroids and NSAIDs.  Low suspicion for septic arthritis given lack of penetrating injury or surgery.  He was encouraged to follow-up with his primary care provider.  States he has an appointment  in 4 days.  He was given return precautions.  Stable at discharge.  At this time there does not appear to be any evidence of an acute emergency medical condition and the patient appears stable for discharge with appropriate outpatient follow up. Diagnosis was discussed with patient who verbalizes understanding of care plan and is agreeable to discharge. I have discussed return precautions with patient who verbalizes understanding. Patient encouraged to follow-up with their PCP within 1 week. All questions answered.  Note: Portions of this report may have been transcribed using voice recognition software. Every effort was made  to ensure accuracy; however, inadvertent computerized transcription errors may still be present.        Final Clinical Impression(s) / ED Diagnoses Final diagnoses:  Acute right ankle pain    Rx / DC Orders ED Discharge Orders          Ordered    predniSONE (DELTASONE) 20 MG tablet  Daily with breakfast        08/09/23 1710    ibuprofen (ADVIL) 600 MG tablet  Every 8 hours PRN        08/09/23 1710              Michelle Piper, PA-C 08/09/23 1713    Coral Spikes, DO 08/10/23 0000

## 2023-12-11 ENCOUNTER — Encounter (HOSPITAL_COMMUNITY): Payer: Self-pay | Admitting: *Deleted

## 2024-04-28 ENCOUNTER — Other Ambulatory Visit: Payer: Self-pay

## 2024-04-28 ENCOUNTER — Emergency Department (HOSPITAL_BASED_OUTPATIENT_CLINIC_OR_DEPARTMENT_OTHER): Admitting: Radiology

## 2024-04-28 ENCOUNTER — Emergency Department (HOSPITAL_BASED_OUTPATIENT_CLINIC_OR_DEPARTMENT_OTHER)

## 2024-04-28 ENCOUNTER — Emergency Department (HOSPITAL_BASED_OUTPATIENT_CLINIC_OR_DEPARTMENT_OTHER)
Admission: EM | Admit: 2024-04-28 | Discharge: 2024-04-28 | Disposition: A | Discharge status: Home / Self Care | Attending: Emergency Medicine | Admitting: Emergency Medicine

## 2024-04-28 ENCOUNTER — Encounter (HOSPITAL_BASED_OUTPATIENT_CLINIC_OR_DEPARTMENT_OTHER): Payer: Self-pay

## 2024-04-28 DIAGNOSIS — R200 Anesthesia of skin: Secondary | ICD-10-CM | POA: Diagnosis not present

## 2024-04-28 DIAGNOSIS — R202 Paresthesia of skin: Secondary | ICD-10-CM | POA: Insufficient documentation

## 2024-04-28 DIAGNOSIS — R0789 Other chest pain: Secondary | ICD-10-CM | POA: Diagnosis present

## 2024-04-28 LAB — CBC
HCT: 39.8 % (ref 39.0–52.0)
Hemoglobin: 14.1 g/dL (ref 13.0–17.0)
MCH: 31.8 pg (ref 26.0–34.0)
MCHC: 35.4 g/dL (ref 30.0–36.0)
MCV: 89.6 fL (ref 80.0–100.0)
Platelets: 209 K/uL (ref 150–400)
RBC: 4.44 MIL/uL (ref 4.22–5.81)
RDW: 12.5 % (ref 11.5–15.5)
WBC: 4.9 K/uL (ref 4.0–10.5)
nRBC: 0 % (ref 0.0–0.2)

## 2024-04-28 LAB — BASIC METABOLIC PANEL WITH GFR
Anion gap: 12 (ref 5–15)
BUN: 15 mg/dL (ref 6–20)
CO2: 23 mmol/L (ref 22–32)
Calcium: 9.2 mg/dL (ref 8.9–10.3)
Chloride: 105 mmol/L (ref 98–111)
Creatinine, Ser: 0.95 mg/dL (ref 0.61–1.24)
GFR, Estimated: >60 mL/min (ref >60)
Glucose, Bld: 114 mg/dL — ABNORMAL HIGH (ref 70–99)
Potassium: 3.2 mmol/L — ABNORMAL LOW (ref 3.5–5.1)
Sodium: 139 mmol/L (ref 135–145)

## 2024-04-28 LAB — TROPONIN T, HIGH SENSITIVITY
Troponin T High Sensitivity: <15 ng/L (ref <19)
Troponin T High Sensitivity: <15 ng/L (ref <19)

## 2024-04-28 MED ORDER — POTASSIUM CHLORIDE CRYS ER 20 MEQ PO TBCR
40.0000 meq | EXTENDED_RELEASE_TABLET | Freq: Once | ORAL | Status: AC
  Administered 2024-04-28: 40 meq via ORAL
  Filled 2024-04-28: qty 2

## 2024-04-28 NOTE — Discharge Instructions (Signed)
 You were seen in the ER for concerns of chest pain and numbness/tingling in the right arm. Your labs and imaging were thankfully reassuring today with no signs of a heart attack or other cardiac abnormality. Your head CT was negative for a stroke, mass, or other concerning findings. I am unsure what caused these symptoms to develop today. Please follow up with your primary care provider and return to the ER for concerns of worsening symptoms.

## 2024-04-28 NOTE — ED Triage Notes (Signed)
 Pt reports chest pain that came on yesterday and was worse today. Pt reports he was driving and had numbness to right hand that started approx 20 min ago. Pt reports headache as well. Pt speech is clear, steady gait. Pt has tingling sensation to right arm.

## 2024-04-28 NOTE — ED Notes (Signed)
 Pt bedded in ER and spoke with PA regarding pt symptoms.

## 2024-04-28 NOTE — ED Provider Notes (Signed)
 [place] EMERGENCY DEPARTMENT AT [facility] Provider Note   CSN: [id number] Arrival date & time: 04/28/24  2006     Patient presents with: Chest Pain and Numbness   Timothy [name] is a 43 y.o. male.  Patient with past history significant for morbid obesity, hypogonadism, and chest pain presents ED with concerns of chest pain and numbness.  He reports that while he was driving today, he began experience some right arm numbness with some associated chest discomfort.  Denies any nausea, vomiting, diaphoresis.  States that the tingling continues but has diminished.  No reported headache, vision changes, or any other deficits in any area.  No feelings of slurred speech or difficulty with word finding.   Chest Pain      Prior to Admission medications   Medication Sig Start Date End Date Taking? Authorizing Provider  calcium carbonate (TUMS - DOSED IN MG ELEMENTAL CALCIUM) 500 MG chewable tablet Chew 1 tablet by mouth daily.    [provider]  cephALEXin (KEFLEX) 500 MG capsule Take 500 mg by mouth 2 (two) times daily. 12/31/21   [provider]  clomiPHENE  (CLOMID ) 50 MG tablet TAKE 1 TABLET 3 TIMES WEEKLY *WAITING ON PA** 02/13/17   [place], MD  clonazePAM (KLONOPIN) 0.5 MG tablet Take by mouth.    [provider]  HYDROcodone -acetaminophen  (NORCO/VICODIN) 5-325 MG tablet Take 1 tablet by mouth every 4 (four) hours as needed. 05/24/17   Jamelle Lorrayne HERO, NP  Multiple Vitamin (MULTIVITAMIN) tablet Take 1 tablet by mouth daily.    [provider]  valACYclovir (VALTREX) 500 MG tablet Take by mouth.    [provider]  WEGOVY 0.25 MG/0.5ML SOAJ SMARTSIG:0.25 Milligram(s) SUB-Q Once a Week 11/27/21   [provider]    Allergies: Amoxicillin    Review of Systems  Cardiovascular:  Positive for chest pain.  All other systems reviewed and are negative.   Updated Vital Signs BP 131/87 (BP Location: Right Arm)   Pulse 65    Temp 98.3 F (36.8 C) (Oral)   Resp 20   Ht 6' (1.829 m)   Wt (!) 142.9 kg   SpO2 98%   BMI 42.72 kg/m   Physical Exam Vitals and nursing note reviewed.  Constitutional:      General: He is not in acute distress.    Appearance: He is well-developed.  HENT:     Head: Normocephalic and atraumatic.  Eyes:     Conjunctiva/sclera: Conjunctivae normal.  Cardiovascular:     Rate and Rhythm: Normal rate and regular rhythm.     Heart sounds: No murmur heard. Pulmonary:     Effort: Pulmonary effort is normal. No respiratory distress.     Breath sounds: Normal breath sounds. No decreased breath sounds, wheezing, rhonchi or rales.  Abdominal:     Palpations: Abdomen is soft.     Tenderness: There is no abdominal tenderness.  Musculoskeletal:        General: No swelling.     Cervical back: Neck supple.  Skin:    General: Skin is warm and dry.     Capillary Refill: Capillary refill takes less than 2 seconds.  Neurological:     General: No focal deficit present.     Mental Status: He is alert and oriented to person, place, and time.     Cranial Nerves: No cranial nerve deficit.     Motor: No weakness.     Comments: CN II-XII intact. Symmetric strength in bilateral  upper and lower extremities. Subjective sensory change with tingling in the right arm primarily from mid upper arm through the hand with digits 1-3 involved.  Psychiatric:        Mood and Affect: Mood normal.     (all labs ordered are listed, but only abnormal results are displayed) Labs Reviewed  BASIC METABOLIC PANEL WITH GFR - Abnormal; Notable for the following components:      Result Value   Potassium 3.2 (*)    Glucose, Bld 114 (*)    All other components within normal limits  CBC  TROPONIN T, HIGH SENSITIVITY  TROPONIN T, HIGH SENSITIVITY    EKG: None  Radiology: CT Head Wo Contrast Result Date: 04/28/2024 CLINICAL DATA:  Neuro deficit, acute, stroke suspected Pt reports chest pain that came on yesterday  and was worse today. Pt reports he was driving and had numbness to right hand that started approx 20 min ago. EXAM: CT HEAD WITHOUT CONTRAST TECHNIQUE: Contiguous axial images were obtained from the base of the skull through the vertex without intravenous contrast. RADIATION DOSE REDUCTION: This exam was performed according to the departmental dose-optimization program which includes automated exposure control, adjustment of the mA and/or kV according to patient size and/or use of iterative reconstruction technique. COMPARISON:  None Available. FINDINGS: Brain: No evidence of large-territorial acute infarction. No parenchymal hemorrhage. No mass lesion. No extra-axial collection. No mass effect or midline shift. No hydrocephalus. Basilar cisterns are patent. Vascular: No hyperdense vessel. Skull: No acute fracture or focal lesion. Sinuses/Orbits: Paranasal sinuses and mastoid air cells are clear. The orbits are unremarkable. Other: None. IMPRESSION: No acute intracranial abnormality. Electronically Signed   By: Morgane  Naveau M.D.   On: 04/28/2024 20:41   DG Chest 2 View Result Date: 04/28/2024 CLINICAL DATA:  Chest pain. EXAM: CHEST - 2 VIEW COMPARISON:  03/27/2017. FINDINGS: Trachea is midline. Heart size normal. Lungs are clear. No pleural fluid. IMPRESSION: Negative. Electronically Signed   By: Newell Eke M.D.   On: 04/28/2024 20:37     Procedures   Medications Ordered in the ED  potassium chloride  SA (KLOR-CON  M) CR tablet 40 mEq (40 mEq Oral Given 04/28/24 2341)                                    Medical Decision Making Amount and/or Complexity of Data Reviewed Labs: ordered. Radiology: ordered.  Risk Prescription drug management.   This patient presents to the ED for concern of chest pain, numbness, this involves an extensive number of treatment options, and is a complaint that carries with it a high risk of complications and morbidity.  The differential diagnosis includes ACS,  stroke, ulnar neuropathy, carpal tunnel syndrome, PE   Co morbidities that complicate the patient evaluation  History of atypical chest pain, morbid obesity   Lab Tests:  I Ordered, and personally interpreted labs.  The pertinent results include: CBC unremarkable, BMP with mild hypokalemia 3.2, troponin negative x 2   Imaging Studies ordered:  I ordered imaging studies including chest x-ray, CT head I independently visualized and interpreted imaging which showed no acute cardiopulmonary process, CT head negative for intracranial normalities I agree with the radiologist interpretation   Cardiac Monitoring: / EKG:  The patient was maintained on a cardiac monitor.  I personally viewed and interpreted the cardiac monitored which showed an underlying rhythm of: Sinus rhythm   Consultations Obtained:  I  requested consultation with none,  and discussed lab and imaging findings as well as pertinent plan - they recommend: N/A   Problem List / ED Course / Critical interventions / Medication management  Patient with past history of morbid obesity, atypical chest and presents the emergency department concerns of chest pain and numbness.  Reports that he began experiencing chest pain that started yesterday no centralized in nature but began to worsen today.  States while he was driving, began to experience some numbness in the right hand as well as a dull headache.  States that the headache is now resolved.  Tingling still present.  Denies any feelings of slurred speech or word finding difficulty. On exam, patient is not acutely ill-appearing.  No abnormal heart or lung sounds.  Symmetric strength bilaterally in upper lower extremities.  Subjective sensory changes with diminished sensation/tingling to the right upper arm that is slowly improving.  Will proceed with cardiac workup with CT imaging of the head to rule out stroke versus cardiac cause. Labs are reassuring with troponin negative at 15  minimal repeat.  BMP with mild hypokalemia 3.2.  Chest x-ray and CT head negative. Repeat troponin remains negative at less than 15.  Patient given 40 mEq of potassium chloride .  Unclear etiology of his symptoms earlier today.  Currently feel that his symptoms have largely resolved.  Doubtful of ACS or stroke.  Encourage close follow-up with primary care provider return precautions discussed.  Stable for outpatient follow-up discharged home. I ordered medication including potassium chloride  for hypokalemia Reevaluation of the patient after these medicines showed that the patient stayed the same I have reviewed the patients home medicines and have made adjustments as needed   Test / Admission - Considered:  Stable for outpatient follow-up.   Final diagnoses:  Other chest pain  Numbness and tingling in right hand    ED Discharge Orders     None          Cecily Legrand LABOR, PA-C 04/28/24 2350    Horton, Roxie HERO, DO 04/29/24 0036
